# Patient Record
Sex: Female | Born: 1990 | Race: Black or African American | Hispanic: No | Marital: Single | Smoking: Former smoker
Health system: Southern US, Community
[De-identification: ages and names within clinical notes are randomized; demographics above are authoritative.]

## PROBLEM LIST (undated history)

## (undated) DIAGNOSIS — I10 Essential (primary) hypertension: Secondary | ICD-10-CM

## (undated) DIAGNOSIS — B009 Herpesviral infection, unspecified: Secondary | ICD-10-CM

## (undated) DIAGNOSIS — N289 Disorder of kidney and ureter, unspecified: Secondary | ICD-10-CM

## (undated) HISTORY — PX: COSMETIC SURGERY: SHX468

## (undated) HISTORY — DX: Essential (primary) hypertension: I10

## (undated) HISTORY — PX: DILATION AND CURETTAGE OF UTERUS: SHX78

---
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
## 2008-03-28 ENCOUNTER — Emergency Department (HOSPITAL_COMMUNITY): Admission: EM | Admit: 2008-03-28 | Discharge: 2008-03-28 | Payer: Self-pay | Admitting: Emergency Medicine

## 2008-04-05 ENCOUNTER — Emergency Department (HOSPITAL_COMMUNITY): Admission: EM | Admit: 2008-04-05 | Discharge: 2008-04-05 | Payer: Self-pay | Admitting: Emergency Medicine

## 2008-08-27 ENCOUNTER — Emergency Department (HOSPITAL_COMMUNITY): Admission: EM | Admit: 2008-08-27 | Discharge: 2008-08-27 | Payer: Self-pay | Admitting: Emergency Medicine

## 2009-08-07 ENCOUNTER — Emergency Department (HOSPITAL_COMMUNITY): Admission: EM | Admit: 2009-08-07 | Discharge: 2009-08-07 | Payer: Self-pay | Admitting: Emergency Medicine

## 2010-02-02 ENCOUNTER — Other Ambulatory Visit: Admission: RE | Admit: 2010-02-02 | Discharge: 2010-02-02 | Payer: Self-pay | Admitting: Obstetrics and Gynecology

## 2010-02-08 ENCOUNTER — Ambulatory Visit (HOSPITAL_COMMUNITY): Admission: RE | Admit: 2010-02-08 | Discharge: 2010-02-08 | Payer: Self-pay | Admitting: Obstetrics & Gynecology

## 2010-08-15 LAB — RAPID STREP SCREEN (MED CTR MEBANE ONLY): Streptococcus, Group A Screen (Direct): NEGATIVE

## 2010-08-21 ENCOUNTER — Inpatient Hospital Stay (HOSPITAL_COMMUNITY)
Admission: AD | Admit: 2010-08-21 | Discharge: 2010-08-23 | DRG: 775 | Disposition: A | Discharge status: Home / Self Care | Payer: Medicaid Other | Source: Ambulatory Visit | Attending: Family Medicine | Admitting: Family Medicine

## 2010-08-21 LAB — CBC
HCT: 35.6 % — ABNORMAL LOW (ref 36.0–46.0)
Hemoglobin: 12.1 g/dL (ref 12.0–15.0)
MCH: 30.4 pg (ref 26.0–34.0)
MCV: 89.4 fL (ref 78.0–100.0)
Platelets: 268 10*3/uL (ref 150–400)
RBC: 3.98 MIL/uL (ref 3.87–5.11)
RDW: 13.3 % (ref 11.5–15.5)
WBC: 10 10*3/uL (ref 4.0–10.5)

## 2010-08-21 LAB — RPR: RPR Ser Ql: NONREACTIVE

## 2010-09-01 LAB — PREGNANCY, URINE: Preg Test, Ur: NEGATIVE

## 2010-10-08 NOTE — Discharge Summary (Signed)
NAMEEZRIE, BUNYAN          ACCOUNT NO.:  [id number]   MEDICAL RECORD NO.:  [id number]          PATIENT TYPE:  OIB   LOCATION:  A415                          FACILITY:  [facility]   PHYSICIAN:  Tilda Burrow, M.D. DATE OF BIRTH:  02-May-1991   DATE OF ADMISSION:  01/18/2006  DATE OF DISCHARGE:  08/29/2007LH                                 DISCHARGE SUMMARY   The patient's laboratory findings and ultrasound findings are all within  normal limits.  I even spoke with Dr. Tyron Russell in radiology, and he reviewed  the films again and noted no kidney stone on review.  The patient states  that her pain is much improved now and desires discharge home, so we are  going to discharge her home to followup in the office one day next week or  p.r.n.      Zerita Boers, Lanier Clam      Tilda Burrow, M.D.  Electronically Signed    DL/MEDQ  D:  09/81/1914  T:  01/18/2006  Job:  [id number]

## 2010-10-08 NOTE — Discharge Summary (Signed)
NAMECAYLEA, Laura Andersen          ACCOUNT NO.:  [id number]   MEDICAL RECORD NO.:  [id number]          PATIENT TYPE:  INP   LOCATION:  A413                          FACILITY:  [facility]   PHYSICIAN:  Tilda Burrow, M.D. DATE OF BIRTH:  02-03-1991   DATE OF ADMISSION:  05/13/2006  DATE OF DISCHARGE:  12/25/2007LH                               DISCHARGE SUMMARY   ADMITTING DIAGNOSIS:  Pregnancy term, elective induction of labor.   DISCHARGE DIAGNOSIS:  Pregnancy term delivered.   PROCEDURE:  Foley bulb cervical ripening December 22. December 23  Pitocin induction of labor. December 23 had continuous lumbar epidural  catheter placed by Dr. Tilda Burrow. December 23 spontaneous vertex  vaginal delivery by Dr. Tilda Burrow.   HOSPITAL SUMMARY:  This 20 year old female was admitted for induction of  labor as per patient request. She is [redacted] weeks gestation. Was admitted  with cervix 2 cm, 75% effaced, -2.   HOSPITAL COURSE:  The patient was admitted. Had a Foley bulb placed  overnight with good results. She received IV analgesics and then  epidural. Pitocin augmentation with labor was initiated. The patient  went on to spontaneous vertex vaginal delivery with healthy infant,  Apgars 9, 9, female infant. There were small lacerations requiring single  suture for tissue repair. The patient had an uneventful postpartum  course. Will be followed up in our office in four weeks for  contraception counseling and future plans.      Tilda Burrow, M.D.  Electronically Signed     JVF/MEDQ  D:  06/02/2006  T:  06/02/2006  Job:  [id number]

## 2010-10-08 NOTE — Discharge Summary (Signed)
Laura Andersen, Laura Andersen          ACCOUNT NO.:  [id number]   MEDICAL RECORD NO.:  [id number]          PATIENT TYPE:  OIB   LOCATION:  LDR5                          FACILITY:  [facility]   PHYSICIAN:  Tilda Burrow, M.D. DATE OF BIRTH:  01-22-1991   DATE OF ADMISSION:  01/21/2006  DATE OF DISCHARGE:  09/02/2007LH                                 DISCHARGE SUMMARY   OBSERVATION TIME:  0818 hours.   OBSERVATION ADMIT:  4 p.m. September 1.   DISCHARGE:  10 a.m. September 2.   HOSPITAL SUMMARY:  This 20 year old female pregnant with [facility] April 23, 2006, based on menstrual history, placing her at approximately [redacted] weeks  gestation, presents with malaise, low-grade temperature, feeling ill for  approximately one day.  She presents to the hospital complaining of a mild  cough, as well as some nausea.  Exam shows bronchitic changes in the left  lung fields more than the right.  Vital signs on admission include  temperature right at 100.  Review of old records show that she had had a  renal ultrasound  early in the pregnancy which showed a complex cyst in the  right kidney, 3.4 x 3.1 x 3.9 cm, which is to be monitored and followed up  postpartum.   HOSPITAL COURSE:  The patient was felt to be a possible outpatient  candidate.  A CBC was ordered which showed white count 11,900, hemoglobin  10.6, hematocrit 30.5.  Urinalysis was obtained with specific gravity 1.020,  trace hemoglobin, 30 mg percent protein, and small leukocyte esterase with  the possibility of a urinary tract infection, as well, is considered.  The  patient spiked a fever to 103.  Did not have any CVA tenderness.  She was  treated with azithromycin for presumed bronchitis or early pneumonia.  Overnight, she felt significantly better without malaise.  The following  day, the back was nontender so she was discharged home on erythromycin and  Macrodantin for followup in two weeks.   DISCHARGE DIAGNOSES:  1. Bronchitis.  2.  Urinary tract infection.  3. Complex cyst right kidney, stable   FOLLOWUP:  Follow up in three days at [facility].      Tilda Burrow, M.D.  Electronically Signed    JVF/MEDQ  D:  01/22/2006  T:  01/23/2006  Job:  [id number]

## 2010-10-08 NOTE — H&P (Signed)
NAMEMARLET, Laura Andersen          ACCOUNT NO.:  [id number]   MEDICAL RECORD NO.:  [id number]          PATIENT TYPE:  INP   LOCATION:  A413                          FACILITY:  [facility]   PHYSICIAN:  Tilda Burrow, M.D. DATE OF BIRTH:  1990-10-01   DATE OF ADMISSION:  05/13/2006  DATE OF DISCHARGE:  LH                              HISTORY & PHYSICAL   ADMITTING DIAGNOSIS:  Pregnancy [redacted] weeks gestation, elective induction  of labor.   HISTORY OF PRESENT ILLNESS:  This 20 year old female, gravida 2, para 0,  AB 1, LMP August 04, 2005, which placed initial [facility] of May 11, 2006,  was admitted at [redacted] weeks gestation after being followed through our  office.  She has had 15 prenatal visits, has made steady progress, and  is now 2 cm, 75% effaced, -2, very posterior cervix, and is admitted as  per her request for induction of labor as we enter the holidays.   PAST MEDICAL HISTORY:  Abnormal Pap smear with HPV present, but  otherwise medically she has a benign medical history.   SURGICAL HISTORY:  D&C in 2005 for miscarriage.   ALLERGIES:  AMOXICILLIN.   SOCIAL HISTORY:  High school student dropout, allegedly seeking GED.  Baby's father is supportive.  Works at __________.  Lives with parents.   Height 5 feet 6 inches, weight 175 pounds, approximately 25-pound weight  gain.   PRENATAL LABORATORIES:  Blood type O positive.  Urine drug screen  positive for THC initially.  Rubella immunity equivocal.  Hemoglobin 12,  hematocrit 37.  Hepatitis, HIV, RPR, GC, and Chlamydia.  GC positive  initially, treated, and repeated, and is negative.  Group B  Streptococcus is negative as well.  She is admitted for labor  management.   PHYSICAL EXAMINATION:  ABDOMEN:  Shows a term-size fetus, estimated  fetal weight 6-1/2 to 7 pounds, 37 cm fundal height.  PELVIC:  Cervix 2, 75, -2, posterior, vertex presentation.   PLAN:  Foley bulb overnight.  Foley bulb was approximately at  approximately 6:30  p.m., and with the significant cervical changes  already present, it gives her a lot of pressure almost immediately.  We  will start the IV, give her some analgesics, and attempt to manage her  pain over the next hour.  Anticipate good cervical changes in response  to Foley bulb, and will proceed with Pitocin in the morning early.      Tilda Burrow, M.D.  Electronically Signed     JVF/MEDQ  D:  05/13/2006  T:  05/15/2006  Job:  [id number]   cc:   [facility]   Jeoffrey Massed, MD  Fax: [phone]

## 2010-10-08 NOTE — Op Note (Signed)
NAMEDERRISHA, FOOS          ACCOUNT NO.:  [id number]   MEDICAL RECORD NO.:  [id number]          PATIENT TYPE:  INP   LOCATION:  A413                          FACILITY:  [facility]   PHYSICIAN:  Tilda Burrow, M.D. DATE OF BIRTH:  [birth year]   DATE OF PROCEDURE:  05/14/2006  DATE OF DISCHARGE:                               OPERATIVE REPORT   Bernardette progressed nicely through labor.  After epidural was placed at  approximately 8:30 a.m. she had amniotomy and progressed over the next  three hours to completely dilated.  A brief second stage was followed by  expulsion from a direct OA position of a healthy female infant, Apgars 9  and 9.  The left arm was released before the fetal body and axillary  traction used to guide the fetus out.  There was a slight malodor.  The  cord was clamped after the infant had been on the abdomen for a minute.  Apgars 9 and 9 were assigned.  The infant was taken to the warmer for  subsequent care.  The placenta delivered intact Tomasa Blase presentation.  Cord blood gases were obtained and are pending at this time.  There was  a small right labia minora laceration that required a single suture for  tissue injury approximation.  Otherwise, delivery was uncomplicated and  EBL 450 mL.  Sponge and needle counts were correct.      Tilda Burrow, M.D.  Electronically Signed     JVF/MEDQ  D:  05/14/2006  T:  05/15/2006  Job:  [id number]   cc:   Jeoffrey Massed, MD  Fax: [phone]

## 2010-10-08 NOTE — Op Note (Signed)
[name], Laura Andersen          ACCOUNT NO.:  [id number]   MEDICAL RECORD NO.:  [id number]          PATIENT TYPE:  INP   LOCATION:  A413                          FACILITY:  [facility]   PHYSICIAN:  Tilda Burrow, M.D. DATE OF BIRTH:  [birth year]   DATE OF PROCEDURE:  05/14/2006  DATE OF DISCHARGE:  05/16/2006                               OPERATIVE REPORT   Continuous lumbar epidural catheter placed after prepping and draping  the back.  Epidural was placed at appropriately 8:30 a.m.  Loss-of-  resistance technique was used at the L2-3 interspace to identify the  epidural space after which 5 mL of 1.5% Xylocaine with epinephrine was  infused followed by placement of the epidural catheter 3 cm in the  epidural space, taping it to the back after removal of the Tuohy needle  and connection to the pulp where it was administered at 14 mL per hour  after a 7 mL fluid bolus.  The patient tolerated the procedure well with  excellent analgesic effect at T10 levels.      Tilda Burrow, M.D.  Electronically Signed     JVF/MEDQ  D:  06/02/2006  T:  06/02/2006  Job:  [id number]

## 2010-10-08 NOTE — Op Note (Signed)
NAME:  Laura Andersen, Laura Andersen                    ACCOUNT NO.:  [id number]   MEDICAL RECORD NO.:  [id number]                   PATIENT TYPE:  INP   LOCATION:  LDR3                                 FACILITY:  [facility]   PHYSICIAN:  Lazaro Arms, M.D.                DATE OF BIRTH:  [birth year]   DATE OF PROCEDURE:  05/24/2003  DATE OF DISCHARGE:                                 OPERATIVE REPORT   PREOPERATIVE DIAGNOSIS:  Incomplete AB in the first trimester.   POSTOPERATIVE DIAGNOSIS:  Incomplete AB in the first trimester.   PROCEDURE:  A cervical dilatation with uterine curettage.   SURGEON:  Lazaro Arms, M.D.   ANESTHESIA:  MAC with paracervical block.   FINDINGS:  The patient presented to the emergency room last night  complaining of vaginal bleeding.  She was found to have a 7 week 6 day  embryo with no fetal cardiac activity, a low HCG of 1500 and she was  bleeding.  The cervix was not opened.   DESCRIPTION OF OPERATION:  The patient was taken to the operating room and  placed in the supine position where she underwent MAC anesthesia.  A Grave's  speculum was placed after prep and drape.  She had a paracervical block  placed using 1% lidocaine plain.  The cervix was dilated serially to allow  passage of an 8 curved suction curet.  Three passes were made.  A gentle  sharp curettage was performed.  Another pass with the suction curet was  performed and the uterus was evacuated without difficulty.  The patient  tolerated the procedure well.  She experienced minimal blood loss, was taken  to the recovery room in good stable condition.  All counts were correct.      ___________________________________________                                            Lazaro Arms, M.D.   Loraine Maple  D:  05/24/2003  T:  05/24/2003  Job:  [id number]

## 2010-10-08 NOTE — H&P (Signed)
NAMETIMMY, CLEVERLY          ACCOUNT NO.:  [id number]   MEDICAL RECORD NO.:  [id number]          PATIENT TYPE:  OIB   LOCATION:  A415                          FACILITY:  [facility]   PHYSICIAN:  Tilda Burrow, M.D. DATE OF BIRTH:  04/06/1991   DATE OF ADMISSION:  01/18/2006  DATE OF DISCHARGE:  LH                                HISTORY & PHYSICAL   HISTORY OF PRESENT ILLNESS:  Laura Andersen is a 20 year old gravida 2, para 0 --  EDC is May 11, 2006 -- at 23 weeks and with severe left-side and flank  pain since 6:30 a.m.   MEDICAL HISTORY:  Positive for HPV.   SURGICAL HISTORY:  Positive for a D&C.   ALLERGIES:  She is allergic to AMOXICILLIN.   PRENATAL COURSE:  Uneventful up until this point.  Blood type is O positive.  UDS is positive for THC.  Rubella is equivocal.  Hepatitis B surface antigen  is negative.  HIV is negative.  Serology is nonreactive.  GC initially was  positive; the patient was treated.  There is not a repeat negative on here.  Her AFP was normal.   ASSESSMENT:  Vital signs are stable.  Fetal heart rate is stable.  There is  much fetal activity noted.  Abdomen is soft.  Tender on the left side with  exam.  Also, there is mild costovertebral angle tenderness.   PLAN:  We are going to get a CBC, a catheterized UA, an ultrasound and  manage her pain with IV pain medicine and reevaluate when her labs and  ultrasound are done.      Laura Andersen, Lanier Clam      Tilda Burrow, M.D.  Electronically Signed    DL/MEDQ  D:  16/02/9603  T:  01/18/2006  Job:  [id number]   cc:   [facility]

## 2010-11-06 ENCOUNTER — Emergency Department (HOSPITAL_COMMUNITY)
Admission: EM | Admit: 2010-11-06 | Discharge: 2010-11-06 | Disposition: A | Discharge status: Home / Self Care | Payer: Medicaid Other | Attending: Emergency Medicine | Admitting: Emergency Medicine

## 2010-11-06 ENCOUNTER — Emergency Department (HOSPITAL_COMMUNITY): Payer: Medicaid Other

## 2010-11-06 DIAGNOSIS — N201 Calculus of ureter: Secondary | ICD-10-CM | POA: Insufficient documentation

## 2010-11-06 LAB — URINALYSIS, ROUTINE W REFLEX MICROSCOPIC
Bilirubin Urine: NEGATIVE
Glucose, UA: NEGATIVE mg/dL
Ketones, ur: NEGATIVE mg/dL
Nitrite: NEGATIVE
Protein, ur: NEGATIVE mg/dL
Specific Gravity, Urine: >1.03 — ABNORMAL HIGH (ref 1.005–1.030)
Urobilinogen, UA: 0.2 mg/dL (ref 0.0–1.0)
pH: 6.5 (ref 5.0–8.0)

## 2010-11-06 LAB — URINE MICROSCOPIC-ADD ON

## 2010-11-06 LAB — DIFFERENTIAL
Basophils Absolute: 0 10*3/uL (ref 0.0–0.1)
Basophils Relative: 0 % (ref 0–1)
Eosinophils Absolute: 0.1 10*3/uL (ref 0.0–0.7)
Eosinophils Relative: 1 % (ref 0–5)
Lymphocytes Relative: 31 % (ref 12–46)
Lymphs Abs: 2.9 10*3/uL (ref 0.7–4.0)
Monocytes Absolute: 0.5 10*3/uL (ref 0.1–1.0)
Monocytes Relative: 6 % (ref 3–12)
Neutro Abs: 5.6 10*3/uL (ref 1.7–7.7)
Neutrophils Relative %: 62 % (ref 43–77)

## 2010-11-06 LAB — BASIC METABOLIC PANEL
BUN: 9 mg/dL (ref 6–23)
CO2: 24 mEq/L (ref 19–32)
Calcium: 10 mg/dL (ref 8.4–10.5)
Chloride: 105 mEq/L (ref 96–112)
Creatinine, Ser: 0.84 mg/dL (ref 0.50–1.10)
GFR calc Af Amer: >60 mL/min (ref >60)
GFR calc non Af Amer: >60 mL/min (ref >60)
Glucose, Bld: 98 mg/dL (ref 70–99)
Potassium: 3.5 mEq/L (ref 3.5–5.1)
Sodium: 139 mEq/L (ref 135–145)

## 2010-11-06 LAB — CBC
HCT: 37.6 % (ref 36.0–46.0)
Hemoglobin: 12.7 g/dL (ref 12.0–15.0)
MCH: 29.7 pg (ref 26.0–34.0)
MCHC: 33.8 g/dL (ref 30.0–36.0)
MCV: 87.9 fL (ref 78.0–100.0)
Platelets: 397 10*3/uL (ref 150–400)
RBC: 4.28 MIL/uL (ref 3.87–5.11)
RDW: 12.4 % (ref 11.5–15.5)
WBC: 9.1 10*3/uL (ref 4.0–10.5)

## 2010-11-06 LAB — POCT PREGNANCY, URINE: Preg Test, Ur: NEGATIVE

## 2011-02-15 LAB — WET PREP, GENITAL
Clue Cells Wet Prep HPF POC: NONE SEEN
Trich, Wet Prep: NONE SEEN
WBC, Wet Prep HPF POC: NONE SEEN

## 2011-02-15 LAB — URINALYSIS, ROUTINE W REFLEX MICROSCOPIC
Bilirubin Urine: NEGATIVE
Glucose, UA: NEGATIVE
Ketones, ur: NEGATIVE
Leukocytes, UA: NEGATIVE
Nitrite: NEGATIVE
Protein, ur: NEGATIVE
Specific Gravity, Urine: 1.025
Urobilinogen, UA: 0.2
pH: 5.5

## 2011-02-15 LAB — PREGNANCY, URINE: Preg Test, Ur: NEGATIVE

## 2011-02-15 LAB — URINE MICROSCOPIC-ADD ON

## 2011-02-15 LAB — GC/CHLAMYDIA PROBE AMP, GENITAL: Chlamydia, DNA Probe: NEGATIVE

## 2011-02-17 LAB — DIFFERENTIAL
Basophils Absolute: 0.1
Eosinophils Absolute: 0
Eosinophils Relative: 1
Lymphs Abs: 1.9
Neutrophils Relative %: 74

## 2011-02-17 LAB — BASIC METABOLIC PANEL
BUN: 8
Chloride: 107
Creatinine, Ser: 0.75
GFR calc non Af Amer: >60
Glucose, Bld: 99

## 2011-02-17 LAB — HCG, SERUM, QUALITATIVE: Preg, Serum: NEGATIVE

## 2011-02-17 LAB — CBC
HCT: 36.6
MCV: 92.8
Platelets: 334
RDW: 11.6
WBC: 9.2

## 2011-02-17 LAB — URINALYSIS, ROUTINE W REFLEX MICROSCOPIC
Bilirubin Urine: NEGATIVE
Ketones, ur: NEGATIVE
Nitrite: NEGATIVE
Protein, ur: NEGATIVE

## 2011-02-22 LAB — URINALYSIS, ROUTINE W REFLEX MICROSCOPIC
Bilirubin Urine: NEGATIVE
Hgb urine dipstick: NEGATIVE
Ketones, ur: NEGATIVE
Nitrite: NEGATIVE
Protein, ur: NEGATIVE
Specific Gravity, Urine: 1.02
Urobilinogen, UA: 0.2

## 2011-02-22 LAB — PREGNANCY, URINE: Preg Test, Ur: NEGATIVE

## 2011-02-22 LAB — GC/CHLAMYDIA PROBE AMP, GENITAL
Chlamydia, DNA Probe: NEGATIVE
GC Probe Amp, Genital: NEGATIVE

## 2011-03-03 LAB — URINE MICROSCOPIC-ADD ON

## 2011-03-03 LAB — I-STAT 8, (EC8 V) (CONVERTED LAB)
Acid-base deficit: 2
Chloride: 105
Glucose, Bld: 83
Hemoglobin: 13.3
Potassium: 3.8
Sodium: 139
TCO2: 26

## 2011-03-03 LAB — URINALYSIS, ROUTINE W REFLEX MICROSCOPIC
Glucose, UA: NEGATIVE
Ketones, ur: NEGATIVE
Nitrite: NEGATIVE
Protein, ur: 30 — AB
Urobilinogen, UA: 1

## 2011-03-03 LAB — POCT PREGNANCY, URINE
Operator id: 234501
Preg Test, Ur: NEGATIVE

## 2011-06-18 ENCOUNTER — Emergency Department (HOSPITAL_COMMUNITY): Payer: Medicaid Other

## 2011-06-18 ENCOUNTER — Encounter (HOSPITAL_COMMUNITY): Payer: Self-pay | Admitting: *Deleted

## 2011-06-18 ENCOUNTER — Emergency Department (HOSPITAL_COMMUNITY)
Admission: EM | Admit: 2011-06-18 | Discharge: 2011-06-18 | Disposition: A | Discharge status: Home / Self Care | Payer: Medicaid Other | Attending: Emergency Medicine | Admitting: Emergency Medicine

## 2011-06-18 DIAGNOSIS — F17200 Nicotine dependence, unspecified, uncomplicated: Secondary | ICD-10-CM | POA: Insufficient documentation

## 2011-06-18 DIAGNOSIS — N200 Calculus of kidney: Secondary | ICD-10-CM

## 2011-06-18 DIAGNOSIS — R319 Hematuria, unspecified: Secondary | ICD-10-CM | POA: Insufficient documentation

## 2011-06-18 DIAGNOSIS — N390 Urinary tract infection, site not specified: Secondary | ICD-10-CM

## 2011-06-18 DIAGNOSIS — R109 Unspecified abdominal pain: Secondary | ICD-10-CM | POA: Insufficient documentation

## 2011-06-18 DIAGNOSIS — Z79899 Other long term (current) drug therapy: Secondary | ICD-10-CM | POA: Insufficient documentation

## 2011-06-18 DIAGNOSIS — R112 Nausea with vomiting, unspecified: Secondary | ICD-10-CM | POA: Insufficient documentation

## 2011-06-18 HISTORY — DX: Herpesviral infection, unspecified: B00.9

## 2011-06-18 LAB — URINALYSIS, ROUTINE W REFLEX MICROSCOPIC
Bilirubin Urine: NEGATIVE
Ketones, ur: NEGATIVE mg/dL
Nitrite: POSITIVE — AB
Specific Gravity, Urine: 1.025 (ref 1.005–1.030)
Urobilinogen, UA: 0.2 mg/dL (ref 0.0–1.0)

## 2011-06-18 MED ORDER — CIPROFLOXACIN HCL 250 MG PO TABS
500.0000 mg | ORAL_TABLET | Freq: Once | ORAL | Status: AC
  Administered 2011-06-18: 500 mg via ORAL
  Filled 2011-06-18: qty 2

## 2011-06-18 MED ORDER — CIPROFLOXACIN HCL 250 MG PO TABS: 250.0000 mg | ORAL_TABLET | Freq: Two times a day (BID) | ORAL | Status: AC

## 2011-06-18 MED ORDER — HYDROCODONE-ACETAMINOPHEN 5-325 MG PO TABS: 1.0000 | ORAL_TABLET | ORAL | Status: AC | PRN

## 2011-06-18 MED ORDER — ONDANSETRON HCL 4 MG/2ML IJ SOLN
4.0000 mg | Freq: Once | INTRAMUSCULAR | Status: AC
  Administered 2011-06-18: 4 mg via INTRAVENOUS
  Filled 2011-06-18: qty 2

## 2011-06-18 MED ORDER — PROMETHAZINE HCL 25 MG PO TABS: 12.5000 mg | ORAL_TABLET | Freq: Four times a day (QID) | ORAL | Status: DC | PRN

## 2011-06-18 MED ORDER — HYDROMORPHONE HCL PF 1 MG/ML IJ SOLN
1.0000 mg | Freq: Once | INTRAMUSCULAR | Status: AC
  Administered 2011-06-18: 1 mg via INTRAVENOUS
  Filled 2011-06-18: qty 1

## 2011-06-18 NOTE — ED Notes (Signed)
Dr. Strand in to see patient at this time.  

## 2011-06-18 NOTE — ED Provider Notes (Addendum)
Urine with 21History     CSN: [id number]  Arrival date & time 06/18/11  4098   First MD Initiated Contact with Patient 06/18/11 [phone]      Chief Complaint  Patient presents with  . Flank Pain    (Consider location/radiation/quality/duration/timing/severity/associated sxs/prior treatment) HPI Rynlee A Rynders is a 21 y.o. female with a h/o kidney stones  who presents to the Emergency Department complaining of right flank pain, nausea, vomiting, and hematuria. Flank pain began at 5:30 AM this morning. Patient has been treating what she thought was a urinary tract infection associated with hematuria usingover-the-counter AZO for the last several days without relief.she denies fever, chills, chest pain, shortness of breath. Past Medical History  Diagnosis Date  . Herpes     History reviewed. No pertinent past surgical history.  History reviewed. No pertinent family history.  History  Substance Use Topics  . Smoking status: Current Everyday Smoker -- 1.0 packs/day  . Smokeless tobacco: Not on file  . Alcohol Use: No    OB History    Grav Para Term Preterm Abortions TAB SAB Ect Mult Living                  Review of Systems 10 Systems reviewed and are negative for acute change except as noted in the HPI. Allergies  Amoxicillin  Home Medications   Current Outpatient Rx  Name Route Sig Dispense Refill  . UNKNOWN TO PATIENT  Reports medication for herpes virus, but doesn't remember name of medication.    Marland Kitchen HYDROCODONE-ACETAMINOPHEN 5-325 MG PO TABS Oral Take 1 tablet by mouth every 4 (four) hours as needed for pain. 15 tablet 0  . PROMETHAZINE HCL 25 MG PO TABS Oral Take 0.5 tablets (12.5 mg total) by mouth every 6 (six) hours as needed for nausea. 10 tablet 0    BP 152/87  Pulse 110  Temp 98.2 F (36.8 C)  Resp 22  Ht 5\' 6"  (1.676 m)  Wt 162 lb (73.483 kg)  BMI 26.15 kg/m2  SpO2 100%  Physical Exam  Nursing note and vitals reviewed. Constitutional: She is  oriented to person, place, and time. She appears well-developed and well-nourished. She appears distressed.  HENT:  Head: Normocephalic.  Right Ear: External ear normal.  Left Ear: External ear normal.  Mouth/Throat: Oropharynx is clear and moist.  Eyes: Conjunctivae and EOM are normal. Pupils are equal, round, and reactive to light.  Neck: Normal range of motion. Neck supple.  Cardiovascular: Normal rate, normal heart sounds and intact distal pulses.   Pulmonary/Chest: Effort normal and breath sounds normal.  Abdominal: Soft. Bowel sounds are normal. She exhibits no mass. There is tenderness. There is no rebound and no guarding.       Right sided upper and mid right abdomen tenderness  Genitourinary:       Mild right cva tenderness to percussion  Musculoskeletal: Normal range of motion.  Neurological: She is alert and oriented to person, place, and time. She has normal reflexes.  Skin: Skin is warm and dry.    ED Course  Procedures (including critical care time) Results for orders placed during the hospital encounter of 06/18/11  URINALYSIS, ROUTINE W REFLEX MICROSCOPIC      Component Value Range   Color, Urine YELLOW  YELLOW    APPearance CLOUDY (*) CLEAR    Specific Gravity, Urine 1.025  1.005 - 1.030    pH 6.5  5.0 - 8.0    Glucose, UA NEGATIVE  NEGATIVE (  mg/dL)   Hgb urine dipstick LARGE (*) NEGATIVE    Bilirubin Urine NEGATIVE  NEGATIVE    Ketones, ur NEGATIVE  NEGATIVE (mg/dL)   Protein, ur 161 (*) NEGATIVE (mg/dL)   Urobilinogen, UA 0.2  0.0 - 1.0 (mg/dL)   Nitrite POSITIVE (*) NEGATIVE    Leukocytes, UA MODERATE (*) NEGATIVE   POCT PREGNANCY, URINE      Component Value Range   Preg Test, Ur NEGATIVE  NEGATIVE   URINE MICROSCOPIC-ADD ON      Component Value Range   WBC, UA 21-50  <3 (WBC/hpf)   RBC / HPF 21-50  <3 (RBC/hpf)   Bacteria, UA MANY (*) RARE    Ct Abdomen Pelvis Wo Contrast  06/18/2011  *RADIOLOGY REPORT*  Clinical Data: Right-sided flank pain, history  of kidney stones, nausea, vomiting  CT ABDOMEN AND PELVIS WITHOUT CONTRAST  Technique:  Multidetector CT imaging of the abdomen and pelvis was performed following the standard protocol without intravenous contrast.  Comparison: Abdominal CT - 10/27/2010; abdominal MRI - 05/11/2007  Findings:  The lack of intravenous contrast limits the newly to evaluate solid abdominal organs.  Several tiny radiopaque gallstones are seen with an otherwise normal-appearing gallbladder, unchanged compared to the prior examination.  Normal hepatic contour.  No ascites.  There is an approximately 3.8 x 2.3 cm partially exophytic mass arising from the mid aspect of the right kidney (image 56, series 4) which appears grossly unchanged compared to abdominal MRI performed 04/2007, there is an incompletely evaluated on the present examination due to lack of intravenous contrast.  Interval passage of previously noted distal left ureteral stone.  Unchanged positioning of left sided pelvic phlebolith (image 83, series 2). No right-sided renal stones.  No hydronephrosis.  No perinephric stranding.  The noncontrast appearance of the bilateral adrenal glands, pancreas and spleen is normal.  The bowel is normal in course and caliber without wall thickening or evidence of obstruction.  Normal noncontrast appearance of the appendix.  No pneumoperitoneum, pneumatosis or portal venous gas. Normal caliber of the abdominal aorta.  No retroperitoneal, mesenteric, pelvic or inguinal lymphadenopathy.  Normal noncontrast appearance of the pelvic organs.  No free fluid in the pelvis.  Limited visualization of the lower thorax is negative for focal airspace opacity or pleural effusion.  No acute or aggressive osseous abnormalities.  IMPRESSION: 1.  No explanation for patient's right-sided flank pain. Specifically, no evidence of nephrolithiasis or urinary obstruction. Normal noncontrast appearance of the appendix.  2.  Interval passage of previously noted  left-sided distal ureteral stone. 3.  Grossly unchanged noncontrast appearance of partially exophytic right-sided renal lesion, grossly stable since the 04/2007 examination, though incompletely evaluated on this noncontrast examination.  Consider repeat enhanced abdominal MRI for further characterization as clinically indicated.  4.  Cholelithiasis without evidence of cholecystitis.  Original Report Authenticated By: Waynard Reeds, M.D.      MDM  Patient with a history of kidney stones who presents with right flank pain, nausea, vomiting x1, and no fever. CT with no evidence of nephrolithiasis or urinary obstruction on the right side.urine with evidence of infection. Initiated antibiotic therapy. Patient was given IV fluids, analgesics and antiemetics with improvement.Pt stable in ED with no significant deterioration in condition.The patient appears reasonably screened and/or stabilized for discharge and I doubt any other medical condition or other [facility] requiring further screening, evaluation, or treatment in the ED at this time prior to discharge.  MDM Reviewed: nursing note and vitals Interpretation: labs  and CT scan         Nicoletta Dress. Colon Branch, MD 06/18/11 0865  Nicoletta Dress. Colon Branch, MD 07/12/11 [phone]

## 2011-06-18 NOTE — ED Notes (Signed)
Pt reporting pain in right side.  States pain began about 0530 this morning.  Reports some nausea, vomiting x1. Repots UTI symptoms, has been treating with Azo.

## 2011-06-18 NOTE — ED Notes (Signed)
Patient reported that she started having right flank pain this morning. States she has had symptoms of a uti recently including blood in urine and pain on urination. States that she has been taking AZO for her symptoms, and now she isn't having any pain on urination but still some blood. Also states that she has a history of kidney stones. Vomited once today, also complaining of nausea.

## 2011-06-18 NOTE — ED Notes (Signed)
poct pregnancy test negative.  

## 2011-08-10 ENCOUNTER — Emergency Department (HOSPITAL_COMMUNITY)
Admission: EM | Admit: 2011-08-10 | Discharge: 2011-08-10 | Disposition: A | Discharge status: Home / Self Care | Payer: BC Managed Care – PPO | Attending: Emergency Medicine | Admitting: Emergency Medicine

## 2011-08-10 ENCOUNTER — Emergency Department (HOSPITAL_COMMUNITY): Payer: BC Managed Care – PPO

## 2011-08-10 ENCOUNTER — Encounter (HOSPITAL_COMMUNITY): Payer: Self-pay

## 2011-08-10 DIAGNOSIS — R110 Nausea: Secondary | ICD-10-CM | POA: Insufficient documentation

## 2011-08-10 DIAGNOSIS — R109 Unspecified abdominal pain: Secondary | ICD-10-CM | POA: Insufficient documentation

## 2011-08-10 LAB — URINALYSIS, ROUTINE W REFLEX MICROSCOPIC
Glucose, UA: NEGATIVE mg/dL
Hgb urine dipstick: NEGATIVE
Specific Gravity, Urine: 1.03 (ref 1.005–1.030)

## 2011-08-10 MED ORDER — KETOROLAC TROMETHAMINE 30 MG/ML IJ SOLN
30.0000 mg | Freq: Once | INTRAMUSCULAR | Status: AC
  Administered 2011-08-10: 30 mg via INTRAVENOUS
  Filled 2011-08-10: qty 1

## 2011-08-10 MED ORDER — ONDANSETRON HCL 4 MG/2ML IJ SOLN
4.0000 mg | Freq: Once | INTRAMUSCULAR | Status: AC
  Administered 2011-08-10: 4 mg via INTRAVENOUS
  Filled 2011-08-10: qty 2

## 2011-08-10 MED ORDER — SODIUM CHLORIDE 0.9 % IV SOLN
Freq: Once | INTRAVENOUS | Status: AC
  Administered 2011-08-10: 10:00:00 via INTRAVENOUS

## 2011-08-10 MED ORDER — PROMETHAZINE HCL 25 MG PO TABS: 12.5000 mg | ORAL_TABLET | Freq: Four times a day (QID) | ORAL | Status: DC | PRN

## 2011-08-10 MED ORDER — HYDROCODONE-ACETAMINOPHEN 5-325 MG PO TABS: 1.0000 | ORAL_TABLET | ORAL | Status: AC | PRN

## 2011-08-10 MED ORDER — SODIUM CHLORIDE 0.9 % IV BOLUS (SEPSIS)
1000.0000 mL | Freq: Once | INTRAVENOUS | Status: AC
  Administered 2011-08-10: 1000 mL via INTRAVENOUS

## 2011-08-10 MED ORDER — HYDROMORPHONE HCL PF 1 MG/ML IJ SOLN
1.0000 mg | Freq: Once | INTRAMUSCULAR | Status: AC
  Administered 2011-08-10: 1 mg via INTRAVENOUS
  Filled 2011-08-10: qty 1

## 2011-08-10 NOTE — ED Notes (Signed)
Pt reports history of kidney stones and c/o r side pain starting this am around 0700.  C/O nausea, no vomiting.

## 2011-08-10 NOTE — ED Provider Notes (Signed)
History   This chart was scribed for [employer]. Colon Branch, MD by Brooks Sailors. The patient was seen in room APA14/APA14. Patient's care was started at 0840.   CSN: [id number]  Arrival date & time 08/10/11  0840   First MD Initiated Contact with Patient 08/10/11 [phone]      Chief Complaint  Patient presents with  . Abdominal Pain    (Consider location/radiation/quality/duration/timing/severity/associated sxs/prior treatment) HPI Laura Andersen is a 21 y.o. female who presents to the Emergency Department complaining of constant severe right abdominal pain onset three hours ago with associated nausea. Patient has h/o of kidney stones, most recent a few months ago where pain went away on own. Denies vomiting, dysuria and hematuria.     Past Medical History  Diagnosis Date  . Herpes     Past Surgical History  Procedure Date  . Dilation and curettage of uterus     No family history on file.  History  Substance Use Topics  . Smoking status: Current Everyday Smoker -- 1.0 packs/day  . Smokeless tobacco: Not on file  . Alcohol Use: No    OB History    Grav Para Term Preterm Abortions TAB SAB Ect Mult Living                  Review of Systems 10 Systems reviewed and are negative for acute change except as noted in the HPI.  Allergies  Amoxicillin  Home Medications   Current Outpatient Rx  Name Route Sig Dispense Refill  . VALACYCLOVIR HCL 1 G PO TABS Oral Take 1,000 mg by mouth daily.      BP 133/77  Pulse 95  Temp(Src) 97.9 F (36.6 C) (Oral)  Resp 24  Ht 5\' 6"  (1.676 m)  Wt 153 lb (69.4 kg)  BMI 24.69 kg/m2  SpO2 100%  Physical Exam  Nursing note and vitals reviewed. Constitutional: She is oriented to person, place, and time. She appears well-developed and well-nourished. No distress.       uncomfortable  HENT:  Head: Normocephalic and atraumatic.  Eyes: EOM are normal. Pupils are equal, round, and reactive to light.  Neck: Neck supple. No tracheal  deviation present.  Cardiovascular: Normal rate, regular rhythm and normal heart sounds.  Exam reveals no gallop and no friction rub.   No murmur heard. Pulmonary/Chest: Effort normal. No respiratory distress. She has no wheezes.  Abdominal: Soft. She exhibits no distension. There is tenderness (right flank and right side). There is CVA tenderness.  Musculoskeletal: Normal range of motion. She exhibits no edema.  Neurological: She is alert and oriented to person, place, and time. No sensory deficit.  Skin: Skin is warm and dry.  Psychiatric: She has a normal mood and affect. Her behavior is normal.    ED Course  Procedures (including critical care time) DIAGNOSTIC STUDIES: Oxygen Saturation is 100% on room air, normal by my interpretation.    COORDINATION OF CARE: 9:30AM-Patient informed of current plan for treatment and evaluation and agrees with plan at this time.  Results for orders placed during the hospital encounter of 08/10/11  URINALYSIS, ROUTINE W REFLEX MICROSCOPIC      Component Value Range   Color, Urine YELLOW  YELLOW    APPearance CLEAR  CLEAR    Specific Gravity, Urine 1.030  1.005 - 1.030    pH 6.0  5.0 - 8.0    Glucose, UA NEGATIVE  NEGATIVE (mg/dL)   Hgb urine dipstick NEGATIVE  NEGATIVE  Bilirubin Urine NEGATIVE  NEGATIVE    Ketones, ur NEGATIVE  NEGATIVE (mg/dL)   Protein, ur NEGATIVE  NEGATIVE (mg/dL)   Urobilinogen, UA 0.2  0.0 - 1.0 (mg/dL)   Nitrite NEGATIVE  NEGATIVE    Leukocytes, UA NEGATIVE  NEGATIVE   POCT PREGNANCY, URINE      Component Value Range   Preg Test, Ur NEGATIVE  NEGATIVE    Ct Abdomen Pelvis Wo Contrast  08/10/2011  *RADIOLOGY REPORT*  Clinical Data: Right flank pain.  History of stones.  CT ABDOMEN AND PELVIS WITHOUT CONTRAST  Technique:  Multidetector CT imaging of the abdomen and pelvis was performed following the standard protocol without intravenous contrast.  Comparison: 06/18/2011 and 11/06/2010.  Renal ultrasound 02/08/2010. MR  abdomen 05/11/2007.  Findings: Lung bases show mild dependent atelectasis bilaterally. Heart size normal.  No pericardial or pleural effusion.  Liver is unremarkable.  Tiny stones layer in the gallbladder. Adrenal glands are unremarkable.  Mixed attenuation lesion is seen in the interpolar right kidney, measuring 3 cm, stable from 06/18/2011, but possibly enlarged from 11/06/2010.  No urinary stones or obstruction. Spleen, pancreas, stomach and bowel are unremarkable.  Uterus and ovaries are visualized.  No free fluid.  No pathologically enlarged lymph nodes.  No worrisome lytic or sclerotic lesions.  IMPRESSION:  1.  No findings to explain the patient's given symptoms.  No urinary stones or obstruction. 2.  Complex right renal lesion may have increased in size from exams predating 2013.  Repeat non emergent MR abdomen with and without contrast may be helpful in further evaluation, as clinically warranted. 3.  Cholelithiasis.  Original Report Authenticated By: Reyes Ivan, M.D.    MDM  Patient with right flank and side pain since this morning at 7 AM. CT without evidence of stones. Does show gall stones which may be source of pain. Given IVF, analgesic, antiemetic, with relief. Will be given referral to surgeon. Pt feels improved after observation and/or treatment in ED.Pt stable in ED with no significant deterioration in condition.The patient appears reasonably screened and/or stabilized for discharge and I doubt any other medical condition or other [facility] requiring further screening, evaluation, or treatment in the ED at this time prior to discharge.   I personally performed the services described in this documentation, which was scribed in my presence. The recorded information has been reviewed and considered.   MDM Reviewed: nursing note and vitals Interpretation: labs and CT scan           Nicoletta Dress. Colon Branch, MD 08/10/11 [phone]

## 2011-08-10 NOTE — Discharge Instructions (Signed)
Your CT scan did not show kidney stones. It did show that you have gallstones. That may be the source of your pain. I have given new prescriptions for nausea medicine and pain medicine to be used as needed. If you continue to have episodes of pain, followup with the surgeon on yourr paperwork.You  may always  to return to the emergency room for evaluation.

## 2011-11-16 ENCOUNTER — Emergency Department (HOSPITAL_COMMUNITY)
Admission: EM | Admit: 2011-11-16 | Discharge: 2011-11-16 | Discharge status: Against Medical Advice | Payer: Self-pay | Attending: Emergency Medicine | Admitting: Emergency Medicine

## 2011-11-16 ENCOUNTER — Encounter (HOSPITAL_COMMUNITY): Payer: Self-pay | Admitting: *Deleted

## 2011-11-16 DIAGNOSIS — Z79899 Other long term (current) drug therapy: Secondary | ICD-10-CM | POA: Insufficient documentation

## 2011-11-16 DIAGNOSIS — Z880 Allergy status to penicillin: Secondary | ICD-10-CM | POA: Insufficient documentation

## 2011-11-16 DIAGNOSIS — F17200 Nicotine dependence, unspecified, uncomplicated: Secondary | ICD-10-CM | POA: Insufficient documentation

## 2011-11-16 DIAGNOSIS — N898 Other specified noninflammatory disorders of vagina: Secondary | ICD-10-CM | POA: Insufficient documentation

## 2011-11-16 DIAGNOSIS — B009 Herpesviral infection, unspecified: Secondary | ICD-10-CM | POA: Insufficient documentation

## 2011-11-16 NOTE — ED Notes (Signed)
Went to assess pt. Pt not in room. Gown on bed.

## 2011-11-16 NOTE — ED Provider Notes (Signed)
History   This chart was scribed for Laura Jakes, MD by Clarita Crane. The patient was seen in room APA08/APA08. Patient's care was started at 1408.    CSN: [id number]  Arrival date & time 11/16/11  1408   First MD Initiated Contact with Patient 11/16/11 1426      Chief Complaint  Patient presents with  . Vaginal Discharge    (Consider location/radiation/quality/duration/timing/severity/associated sxs/prior treatment) HPI Laura Andersen is a 21 y.o. female who presents to the Emergency Department complaining of mild to moderate white vaginal d/c onset 2 days ago and persistent since. Denies pruritus, dysuria, abdominal pain, back pain, chest pain, SOB, fever, chills, sore throat, congestion, HA, neck pain, cough, nausea, vomiting, diarrhea, visual changes. Denies previous history of similar. Patient with h/o D and C. LMP- Depo Provera Shot.   PCP- None  Past Medical History  Diagnosis Date  . Herpes     Past Surgical History  Procedure Date  . Dilation and curettage of uterus     History reviewed. No pertinent family history.  History  Substance Use Topics  . Smoking status: Current Everyday Smoker -- 1.0 packs/day  . Smokeless tobacco: Not on file  . Alcohol Use: Yes    OB History    Grav Para Term Preterm Abortions TAB SAB Ect Mult Living                  Review of Systems  Constitutional: Negative for fever.  HENT: Negative for rhinorrhea.   Eyes: Negative for pain.  Respiratory: Negative for cough and shortness of breath.   Cardiovascular: Negative for chest pain.  Gastrointestinal: Negative for nausea, vomiting, abdominal pain and diarrhea.  Genitourinary: Positive for vaginal discharge. Negative for dysuria.  Musculoskeletal: Negative for back pain.  Skin: Negative for rash.  Neurological: Negative for weakness and headaches.    Allergies  Amoxicillin  Home Medications   Current Outpatient Rx  Name Route Sig Dispense Refill  .  DIPHENHYDRAMINE HCL 25 MG PO CAPS Oral Take 25 mg by mouth at bedtime as needed. For allergies    . VALACYCLOVIR HCL 1 G PO TABS Oral Take 1,000 mg by mouth daily.      BP 112/75  Pulse 100  Temp 98.2 F (36.8 C) (Oral)  Resp 20  Ht 5\' 6"  (1.676 m)  Wt 172 lb (78.019 kg)  BMI 27.76 kg/m2  SpO2 99%  Physical Exam  Nursing note and vitals reviewed. Constitutional: She is oriented to person, place, and time. She appears well-developed and well-nourished. No distress.  HENT:  Head: Normocephalic and atraumatic.  Mouth/Throat: Oropharynx is clear and moist.  Eyes: EOM are normal. Pupils are equal, round, and reactive to light.  Neck: Neck supple. No tracheal deviation present.  Cardiovascular: Normal rate, regular rhythm and normal heart sounds.  Exam reveals no gallop and no friction rub.   No murmur heard. Pulmonary/Chest: Effort normal. No respiratory distress. She has no wheezes. She has no rales.  Abdominal: Soft. Bowel sounds are normal. She exhibits no distension. There is no tenderness.  Musculoskeletal: Normal range of motion. She exhibits no edema.  Neurological: She is alert and oriented to person, place, and time. No cranial nerve deficit or sensory deficit.  Skin: Skin is warm and dry.  Psychiatric: She has a normal mood and affect. Her behavior is normal.    ED Course  Procedures (including critical care time)  DIAGNOSTIC STUDIES: Oxygen Saturation is 99% on room air, normal by  my interpretation.    COORDINATION OF CARE: 2:52PM-Patient informed of current plan for treatment and evaluation and agrees with plan at this time.      Labs Reviewed  URINALYSIS, ROUTINE W REFLEX MICROSCOPIC  PREGNANCY, URINE  WET PREP, GENITAL  GC/CHLAMYDIA PROBE AMP, GENITAL   No results found. Results for orders placed during the hospital encounter of 08/10/11  URINALYSIS, ROUTINE W REFLEX MICROSCOPIC      Component Value Range   Color, Urine YELLOW  YELLOW   APPearance CLEAR   CLEAR   Specific Gravity, Urine 1.030  1.005 - 1.030   pH 6.0  5.0 - 8.0   Glucose, UA NEGATIVE  NEGATIVE mg/dL   Hgb urine dipstick NEGATIVE  NEGATIVE   Bilirubin Urine NEGATIVE  NEGATIVE   Ketones, ur NEGATIVE  NEGATIVE mg/dL   Protein, ur NEGATIVE  NEGATIVE mg/dL   Urobilinogen, UA 0.2  0.0 - 1.0 mg/dL   Nitrite NEGATIVE  NEGATIVE   Leukocytes, UA NEGATIVE  NEGATIVE  POCT PREGNANCY, URINE      Component Value Range   Preg Test, Ur NEGATIVE  NEGATIVE     1. Vaginal Discharge       MDM  Patient left AMA without informing anybody prior to pelvic exam being done but after being evaluated otherwise. Labs are now back she is not pregnant urinalysis not consistent with urinary tract infection with plan to pelvic exam to rule out the yeast infection or other serious infection related to the discharge.     I personally performed the services described in this documentation, which was scribed in my presence. The recorded information has been reviewed and considered.    Laura Jakes, MD 11/16/11 [phone]

## 2011-11-16 NOTE — ED Notes (Signed)
Vag d/c for 3 days, white, no pain or itching

## 2011-11-16 NOTE — ED Notes (Signed)
Dr. Deretha Emory made aware of pt left AMA

## 2012-03-12 ENCOUNTER — Other Ambulatory Visit (HOSPITAL_COMMUNITY)
Admission: RE | Admit: 2012-03-12 | Discharge: 2012-03-12 | Disposition: A | Discharge status: Home / Self Care | Payer: Medicaid Other | Source: Ambulatory Visit | Attending: Obstetrics & Gynecology | Admitting: Obstetrics & Gynecology

## 2012-03-12 ENCOUNTER — Other Ambulatory Visit: Payer: Self-pay | Admitting: Obstetrics & Gynecology

## 2012-03-12 DIAGNOSIS — Z01419 Encounter for gynecological examination (general) (routine) without abnormal findings: Secondary | ICD-10-CM | POA: Insufficient documentation

## 2013-01-03 ENCOUNTER — Ambulatory Visit: Payer: Self-pay | Admitting: Adult Health

## 2013-03-27 ENCOUNTER — Ambulatory Visit: Payer: Self-pay | Admitting: Advanced Practice Midwife

## 2013-03-27 ENCOUNTER — Encounter (INDEPENDENT_AMBULATORY_CARE_PROVIDER_SITE_OTHER): Payer: Self-pay

## 2013-03-27 ENCOUNTER — Ambulatory Visit: Payer: Self-pay | Admitting: Adult Health

## 2013-05-22 ENCOUNTER — Emergency Department (HOSPITAL_COMMUNITY)
Admission: EM | Admit: 2013-05-22 | Discharge: 2013-05-22 | Disposition: A | Discharge status: Home / Self Care | Payer: Medicaid Other | Attending: Emergency Medicine | Admitting: Emergency Medicine

## 2013-05-22 ENCOUNTER — Encounter (HOSPITAL_COMMUNITY): Payer: Self-pay | Admitting: Emergency Medicine

## 2013-05-22 DIAGNOSIS — Z8619 Personal history of other infectious and parasitic diseases: Secondary | ICD-10-CM | POA: Insufficient documentation

## 2013-05-22 DIAGNOSIS — R109 Unspecified abdominal pain: Secondary | ICD-10-CM

## 2013-05-22 DIAGNOSIS — R112 Nausea with vomiting, unspecified: Secondary | ICD-10-CM | POA: Insufficient documentation

## 2013-05-22 DIAGNOSIS — Z79899 Other long term (current) drug therapy: Secondary | ICD-10-CM | POA: Insufficient documentation

## 2013-05-22 DIAGNOSIS — Z880 Allergy status to penicillin: Secondary | ICD-10-CM | POA: Insufficient documentation

## 2013-05-22 DIAGNOSIS — F17200 Nicotine dependence, unspecified, uncomplicated: Secondary | ICD-10-CM | POA: Insufficient documentation

## 2013-05-22 DIAGNOSIS — Z87442 Personal history of urinary calculi: Secondary | ICD-10-CM | POA: Insufficient documentation

## 2013-05-22 LAB — URINE MICROSCOPIC-ADD ON

## 2013-05-22 LAB — URINALYSIS, ROUTINE W REFLEX MICROSCOPIC
Bilirubin Urine: NEGATIVE
Glucose, UA: NEGATIVE mg/dL
Protein, ur: NEGATIVE mg/dL

## 2013-05-22 MED ORDER — ONDANSETRON 4 MG PO TBDP
4.0000 mg | ORAL_TABLET | Freq: Once | ORAL | Status: AC
  Administered 2013-05-22: 4 mg via ORAL
  Filled 2013-05-22: qty 1

## 2013-05-22 MED ORDER — TAMSULOSIN HCL 0.4 MG PO CAPS: 0.4000 mg | ORAL_CAPSULE | Freq: Two times a day (BID) | ORAL | Status: DC

## 2013-05-22 MED ORDER — PROMETHAZINE HCL 25 MG PO TABS: 25.0000 mg | ORAL_TABLET | Freq: Four times a day (QID) | ORAL | Status: DC | PRN

## 2013-05-22 MED ORDER — KETOROLAC TROMETHAMINE 60 MG/2ML IM SOLN
60.0000 mg | Freq: Once | INTRAMUSCULAR | Status: AC
  Administered 2013-05-22: 60 mg via INTRAMUSCULAR
  Filled 2013-05-22: qty 2

## 2013-05-22 MED ORDER — NAPROXEN 500 MG PO TABS: 500.0000 mg | ORAL_TABLET | Freq: Two times a day (BID) | ORAL | Status: DC

## 2013-05-22 NOTE — ED Notes (Signed)
Pt ambulating independently w/ steady gait on d/c in no acute distress, A&Ox4. D/c instructions reviewed w/ pt and family - pt and family deny any further questions or concerns at present. Rx given x3  

## 2013-05-22 NOTE — ED Notes (Signed)
Pt reports acute onset of left flank pain - pt admits to vomiting today, no nausea at present - as well as watery diarrhea as well.

## 2013-05-22 NOTE — ED Provider Notes (Signed)
CSN: [id number]     Arrival date & time 05/22/13  0032 History   First MD Initiated Contact with Patient 05/22/13 0059     Chief Complaint  Patient presents with  . Flank Pain   (Consider location/radiation/quality/duration/timing/severity/associated sxs/prior Treatment) HPI Comments: 22 year old female, history of a kidney stone a couple of years ago, presents with a complaint of left-sided flank pain which was acute in onset one hour prior to arrival, associated with one episode of nausea and vomiting, persistent, nothing seems to make it better or worse. She has no associated hematuria that she has noticed, no fevers or chills, no abdominal complaints. She denies being pregnant, vaginal discharge or vaginal bleeding.  Patient is a 22 y.o. female presenting with flank pain. The history is provided by the patient.  Flank Pain    Past Medical History  Diagnosis Date  . Herpes    Past Surgical History  Procedure Laterality Date  . Dilation and curettage of uterus     History reviewed. No pertinent family history. History  Substance Use Topics  . Smoking status: Current Every Day Smoker -- 1.00 packs/day    Types: Cigarettes  . Smokeless tobacco: Not on file  . Alcohol Use: Yes   OB History   Grav Para Term Preterm Abortions TAB SAB Ect Mult Living                 Review of Systems  Genitourinary: Positive for flank pain.  All other systems reviewed and are negative.    Allergies  Amoxicillin  Home Medications   Current Outpatient Rx  Name  Route  Sig  Dispense  Refill  . diphenhydrAMINE (BENADRYL) 25 mg capsule   Oral   Take 25 mg by mouth at bedtime as needed. For allergies         . naproxen (NAPROSYN) 500 MG tablet   Oral   Take 1 tablet (500 mg total) by mouth 2 (two) times daily with a meal.   30 tablet   0   . EXPIRED: promethazine (PHENERGAN) 25 MG tablet   Oral   Take 0.5 tablets (12.5 mg total) by mouth every 6 (six) hours as needed for  nausea.   10 tablet   0   . EXPIRED: promethazine (PHENERGAN) 25 MG tablet   Oral   Take 0.5 tablets (12.5 mg total) by mouth every 6 (six) hours as needed for nausea.   10 tablet   0   . promethazine (PHENERGAN) 25 MG tablet   Oral   Take 1 tablet (25 mg total) by mouth every 6 (six) hours as needed for nausea or vomiting.   12 tablet   0   . tamsulosin (FLOMAX) 0.4 MG CAPS capsule   Oral   Take 1 capsule (0.4 mg total) by mouth 2 (two) times daily.   10 capsule   0   . valACYclovir (VALTREX) 1000 MG tablet   Oral   Take 1,000 mg by mouth daily.          BP 118/85  Temp(Src) 98.2 F (36.8 C) (Oral)  Resp 20  Ht 5\' 6"  (1.676 m)  Wt 178 lb (80.74 kg)  BMI 28.74 kg/m2  SpO2 98%  LMP 04/30/2013 Physical Exam  Nursing note and vitals reviewed. Constitutional: She appears well-developed and well-nourished. No distress.  HENT:  Head: Normocephalic and atraumatic.  Mouth/Throat: Oropharynx is clear and moist. No oropharyngeal exudate.  Eyes: Conjunctivae and EOM are normal. Pupils are equal, round,  and reactive to light. Right eye exhibits no discharge. Left eye exhibits no discharge. No scleral icterus.  Neck: Normal range of motion. Neck supple. No JVD present. No thyromegaly present.  Cardiovascular: Normal rate, regular rhythm, normal heart sounds and intact distal pulses.  Exam reveals no gallop and no friction rub.   No murmur heard. Pulmonary/Chest: Effort normal and breath sounds normal. No respiratory distress. She has no wheezes. She has no rales.  Abdominal: Soft. Bowel sounds are normal. She exhibits no distension and no mass. There is no tenderness.  CVA tenderness present on the left, very soft nontender abdomen  Musculoskeletal: Normal range of motion. She exhibits no edema and no tenderness.  Lymphadenopathy:    She has no cervical adenopathy.  Neurological: She is alert. Coordination normal.  Skin: Skin is warm and dry. No rash noted. No erythema.   Psychiatric: She has a normal mood and affect. Her behavior is normal.    ED Course  Procedures (including critical care time) Labs Review Labs Reviewed  URINALYSIS, ROUTINE W REFLEX MICROSCOPIC - Abnormal; Notable for the following:    Hgb urine dipstick TRACE (*)    All other components within normal limits  URINE MICROSCOPIC-ADD ON - Abnormal; Notable for the following:    Squamous Epithelial / LPF MANY (*)    Bacteria, UA MANY (*)    All other components within normal limits  URINE CULTURE   Imaging Review No results found.  EKG Interpretation   None       MDM   1. Flank pain    The patient does have some reproducible tenderness with percussion of her left costovertebral angle, vital signs are normal, urinalysis pending, likely kidney stone, would also consider muscular strain, urinary tract infection less likely. Toradol ordered  Discussed the findings with the patient, no hematuria, no significant infection, urine will be cultured, patient has had improvement with Toradol, possible kidney stone, conservative therapy and avoid radiation at this time, discussed with the patient who agrees with the plan. Urology followup.   Meds given in ED:  Medications  ketorolac (TORADOL) injection 60 mg (60 mg Intramuscular Given 05/22/13 0115)  ondansetron (ZOFRAN-ODT) disintegrating tablet 4 mg (4 mg Oral Given 05/22/13 0127)    New Prescriptions   NAPROXEN (NAPROSYN) 500 MG TABLET    Take 1 tablet (500 mg total) by mouth 2 (two) times daily with a meal.   PROMETHAZINE (PHENERGAN) 25 MG TABLET    Take 1 tablet (25 mg total) by mouth every 6 (six) hours as needed for nausea or vomiting.   TAMSULOSIN (FLOMAX) 0.4 MG CAPS CAPSULE    Take 1 capsule (0.4 mg total) by mouth 2 (two) times daily.      Vida Roller, MD 05/22/13 [phone]

## 2013-05-23 LAB — URINE CULTURE: Colony Count: 40000

## 2014-02-26 ENCOUNTER — Encounter (HOSPITAL_COMMUNITY): Payer: Self-pay | Admitting: Emergency Medicine

## 2014-02-26 ENCOUNTER — Emergency Department (HOSPITAL_COMMUNITY)
Admission: EM | Admit: 2014-02-26 | Discharge: 2014-02-26 | Disposition: A | Discharge status: Home / Self Care | Payer: Medicaid Other | Attending: Emergency Medicine | Admitting: Emergency Medicine

## 2014-02-26 DIAGNOSIS — Z720 Tobacco use: Secondary | ICD-10-CM | POA: Insufficient documentation

## 2014-02-26 DIAGNOSIS — Z8619 Personal history of other infectious and parasitic diseases: Secondary | ICD-10-CM | POA: Insufficient documentation

## 2014-02-26 DIAGNOSIS — Z880 Allergy status to penicillin: Secondary | ICD-10-CM | POA: Insufficient documentation

## 2014-02-26 DIAGNOSIS — N390 Urinary tract infection, site not specified: Secondary | ICD-10-CM | POA: Insufficient documentation

## 2014-02-26 DIAGNOSIS — Z3202 Encounter for pregnancy test, result negative: Secondary | ICD-10-CM | POA: Insufficient documentation

## 2014-02-26 DIAGNOSIS — Z79899 Other long term (current) drug therapy: Secondary | ICD-10-CM | POA: Insufficient documentation

## 2014-02-26 LAB — URINALYSIS, ROUTINE W REFLEX MICROSCOPIC
BILIRUBIN URINE: NEGATIVE
Glucose, UA: NEGATIVE mg/dL
KETONES UR: NEGATIVE mg/dL
NITRITE: POSITIVE — AB
PROTEIN: 100 mg/dL — AB
SPECIFIC GRAVITY, URINE: 1.02 (ref 1.005–1.030)
UROBILINOGEN UA: 0.2 mg/dL (ref 0.0–1.0)
pH: 6 (ref 5.0–8.0)

## 2014-02-26 LAB — URINE MICROSCOPIC-ADD ON

## 2014-02-26 LAB — PREGNANCY, URINE: Preg Test, Ur: NEGATIVE

## 2014-02-26 MED ORDER — CEPHALEXIN 500 MG PO CAPS: 500.0000 mg | ORAL_CAPSULE | Freq: Four times a day (QID) | ORAL | Status: DC

## 2014-02-26 MED ORDER — CEPHALEXIN 500 MG PO CAPS
500.0000 mg | ORAL_CAPSULE | Freq: Once | ORAL | Status: AC
  Administered 2014-02-26: 500 mg via ORAL
  Filled 2014-02-26: qty 1

## 2014-02-26 NOTE — ED Notes (Signed)
Pt complain of urinary pressure. States she was passing blood in her urine last night. OTC meds are not working

## 2014-02-26 NOTE — Discharge Instructions (Signed)

## 2014-02-26 NOTE — ED Notes (Signed)
Patient given discharge instruction, verbalized understand. Patient ambulatory out of the department.  

## 2014-02-28 LAB — URINE CULTURE

## 2014-03-01 NOTE — ED Provider Notes (Signed)
Medical screening examination/treatment/procedure(s) were performed by non-physician practitioner and as supervising physician I was immediately available for consultation/collaboration.   EKG Interpretation None       Kerston Landeck, MD 03/01/14 1046 

## 2014-03-01 NOTE — ED Provider Notes (Signed)
CSN: [id number]     Arrival date & time 02/26/14  1026 History   First MD Initiated Contact with Patient 02/26/14 1111     Chief Complaint  Patient presents with  . Urinary Retention     (Consider location/radiation/quality/duration/timing/severity/associated sxs/prior Treatment) HPI   Laura Andersen is a 23 y.o. female who presents to the Emergency Department complaining of pressure with urination and blood in her urine that began on the evening prior to ED arrival.  She states that she believes she has a UTI.  She has tried OTC medications w/o relief.  She denies fever, vomiting, back pain or chills.  Reports having previous UTI's and sx's feel similar to previous.    Past Medical History  Diagnosis Date  . Herpes    Past Surgical History  Procedure Laterality Date  . Dilation and curettage of uterus     No family history on file. History  Substance Use Topics  . Smoking status: Current Every Day Smoker -- 1.00 packs/day    Types: Cigarettes  . Smokeless tobacco: Not on file  . Alcohol Use: Yes   OB History   Grav Para Term Preterm Abortions TAB SAB Ect Mult Living                 Review of Systems  Constitutional: Negative for fever, chills, activity change and appetite change.  Respiratory: Negative for cough, chest tightness and shortness of breath.   Cardiovascular: Negative for chest pain.  Gastrointestinal: Negative for nausea, vomiting and abdominal pain.  Genitourinary: Positive for dysuria, urgency, frequency and hematuria. Negative for flank pain, vaginal bleeding, vaginal discharge, difficulty urinating and menstrual problem.  Musculoskeletal: Negative for back pain.  Skin: Negative for rash.  All other systems reviewed and are negative.     Allergies  Amoxicillin  Home Medications   Prior to Admission medications   Medication Sig Start Date End Date Taking? Authorizing Provider  Phenazopyridine HCl (AZO TABS PO) Take 2 tablets by mouth every  6 (six) hours as needed (urinary pain).   Yes Historical Provider, MD  valACYclovir (VALTREX) 1000 MG tablet Take 1,000 mg by mouth daily as needed.    Yes Historical Provider, MD  cephALEXin (KEFLEX) 500 MG capsule Take 1 capsule (500 mg total) by mouth 4 (four) times daily. For 7 days 02/26/14   Tyberius Ryner L. Kathlen Sakurai, PA-C   BP 139/80  Pulse 115  Temp(Src) 98.2 F (36.8 C) (Oral)  Resp 18  Ht 5\' 6"  (1.676 m)  Wt 151 lb (68.493 kg)  BMI 24.38 kg/m2  SpO2 100%  LMP 12/27/2013 Physical Exam  Nursing note and vitals reviewed. Constitutional: She is oriented to person, place, and time. She appears well-developed and well-nourished. No distress.  HENT:  Head: Normocephalic and atraumatic.  Neck: Normal range of motion. Neck supple.  Cardiovascular: Normal rate, regular rhythm, normal heart sounds and intact distal pulses.   No murmur heard. Pulmonary/Chest: Effort normal and breath sounds normal. No respiratory distress.  Abdominal: She exhibits no distension. There is no tenderness. There is no rebound and no guarding.  No CVA tenderness  Musculoskeletal: Normal range of motion.  Neurological: She is alert and oriented to person, place, and time. She exhibits normal muscle tone. Coordination normal.  Skin: Skin is warm and dry.    ED Course  Procedures (including critical care time) Labs Review Labs Reviewed  URINALYSIS, ROUTINE W REFLEX MICROSCOPIC - Abnormal; Notable for the following:    APPearance TURBID (*)  Hgb urine dipstick LARGE (*)    Protein, ur 100 (*)    Nitrite POSITIVE (*)    Leukocytes, UA MODERATE (*)    All other components within normal limits  URINE MICROSCOPIC-ADD ON - Abnormal; Notable for the following:    Bacteria, UA MANY (*)    All other components within normal limits  URINE CULTURE  PREGNANCY, URINE    Imaging Review No results found.   EKG Interpretation None      MDM   Final diagnoses:  Urinary tract infection without complication     Pt is well appearing, non-toxic.  No concerning sx';s for pyelonephritis.  Pt with UTI.  She agrees to pyridium and kelfex,  Urine culture pending.  She also agrees to return here if needed and appears stable for d/c    Ahnna Dungan L. Draden Cottingham, PA-C 03/01/14 [id number]

## 2014-03-03 ENCOUNTER — Telehealth (HOSPITAL_BASED_OUTPATIENT_CLINIC_OR_DEPARTMENT_OTHER): Payer: Self-pay

## 2014-03-03 NOTE — Telephone Encounter (Signed)
Post ED Visit - Positive Culture Follow-up  Culture report reviewed by antimicrobial stewardship pharmacist: []  Laura Andersen, Pharm.D., BCPS $[email or web address][]  Laura Andersen, Pharm.D., BCPS []  Laura Andersen, [place] BCPS, AAHIVP [x]  Laura Andersen, Pharm.D., BCPS, AAHIVP []  Laura Andersen, Pharm.D. []  Laura Andersen, Pharm.D.  Positive Urine culture Treated with Cephalexin, organism sensitive to the same and no further patient follow-up is required at this time.  Laura Andersen, Laura Andersen 03/03/2014, 5:25 AM

## 2014-10-28 ENCOUNTER — Encounter (HOSPITAL_COMMUNITY): Payer: Self-pay | Admitting: Emergency Medicine

## 2014-10-28 ENCOUNTER — Emergency Department (HOSPITAL_COMMUNITY)
Admission: EM | Admit: 2014-10-28 | Discharge: 2014-10-28 | Disposition: A | Discharge status: Home / Self Care | Payer: Medicaid Other | Attending: Emergency Medicine | Admitting: Emergency Medicine

## 2014-10-28 DIAGNOSIS — N898 Other specified noninflammatory disorders of vagina: Secondary | ICD-10-CM | POA: Diagnosis not present

## 2014-10-28 DIAGNOSIS — Z8719 Personal history of other diseases of the digestive system: Secondary | ICD-10-CM | POA: Diagnosis not present

## 2014-10-28 DIAGNOSIS — R000 Tachycardia, unspecified: Secondary | ICD-10-CM | POA: Diagnosis not present

## 2014-10-28 DIAGNOSIS — Z87891 Personal history of nicotine dependence: Secondary | ICD-10-CM | POA: Insufficient documentation

## 2014-10-28 DIAGNOSIS — Z880 Allergy status to penicillin: Secondary | ICD-10-CM | POA: Insufficient documentation

## 2014-10-28 DIAGNOSIS — R300 Dysuria: Secondary | ICD-10-CM | POA: Diagnosis present

## 2014-10-28 DIAGNOSIS — N390 Urinary tract infection, site not specified: Secondary | ICD-10-CM | POA: Insufficient documentation

## 2014-10-28 DIAGNOSIS — Z9889 Other specified postprocedural states: Secondary | ICD-10-CM | POA: Diagnosis not present

## 2014-10-28 LAB — URINALYSIS, ROUTINE W REFLEX MICROSCOPIC
Bilirubin Urine: NEGATIVE
GLUCOSE, UA: NEGATIVE mg/dL
Hgb urine dipstick: NEGATIVE
Ketones, ur: NEGATIVE mg/dL
NITRITE: NEGATIVE
PH: 7 (ref 5.0–8.0)
PROTEIN: NEGATIVE mg/dL
SPECIFIC GRAVITY, URINE: 1.02 (ref 1.005–1.030)
UROBILINOGEN UA: 0.2 mg/dL (ref 0.0–1.0)

## 2014-10-28 LAB — URINE MICROSCOPIC-ADD ON

## 2014-10-28 MED ORDER — ONDANSETRON HCL 4 MG PO TABS
4.0000 mg | ORAL_TABLET | Freq: Once | ORAL | Status: AC
  Administered 2014-10-28: 4 mg via ORAL
  Filled 2014-10-28: qty 1

## 2014-10-28 MED ORDER — PHENAZOPYRIDINE HCL 100 MG PO TABS: 100.0000 mg | ORAL_TABLET | Freq: Three times a day (TID) | ORAL | Status: DC | PRN

## 2014-10-28 MED ORDER — FLUCONAZOLE 100 MG PO TABS
100.0000 mg | ORAL_TABLET | Freq: Once | ORAL | Status: AC
  Administered 2014-10-28: 100 mg via ORAL
  Filled 2014-10-28: qty 1

## 2014-10-28 MED ORDER — CEPHALEXIN 500 MG PO CAPS: 500.0000 mg | ORAL_CAPSULE | Freq: Four times a day (QID) | ORAL | Status: DC

## 2014-10-28 MED ORDER — CEPHALEXIN 500 MG PO CAPS
500.0000 mg | ORAL_CAPSULE | Freq: Once | ORAL | Status: AC
  Administered 2014-10-28: 500 mg via ORAL
  Filled 2014-10-28: qty 1

## 2014-10-28 MED ORDER — PHENAZOPYRIDINE HCL 100 MG PO TABS
100.0000 mg | ORAL_TABLET | Freq: Once | ORAL | Status: AC
  Administered 2014-10-28: 100 mg via ORAL
  Filled 2014-10-28: qty 1

## 2014-10-28 NOTE — ED Notes (Signed)
PT c/o increased urinary frequency with burning x2 days. PT denies any fever of flank pain. PT states hx of UTI.

## 2014-10-28 NOTE — Discharge Instructions (Signed)
Please increase water and juices. Please use Keflex 4 times daily with food, use Pyridium 3 times daily with food. Pyridium may cause the urine to change colors. Please see your physicians at the health department in 7-10 days for recheck of your urine. Urinary Tract Infection Urinary tract infections (UTIs) can develop anywhere along your urinary tract. Your urinary tract is your body's drainage system for removing wastes and extra water. Your urinary tract includes two kidneys, two ureters, a bladder, and a urethra. Your kidneys are a pair of bean-shaped organs. Each kidney is about the size of your fist. They are located below your ribs, one on each side of your spine. CAUSES Infections are caused by microbes, which are microscopic organisms, including fungi, viruses, and bacteria. These organisms are so small that they can only be seen through a microscope. Bacteria are the microbes that most commonly cause UTIs. SYMPTOMS  Symptoms of UTIs may vary by age and gender of the patient and by the location of the infection. Symptoms in young women typically include a frequent and intense urge to urinate and a painful, burning feeling in the bladder or urethra during urination. Older women and men are more likely to be tired, shaky, and weak and have muscle aches and abdominal pain. A fever may mean the infection is in your kidneys. Other symptoms of a kidney infection include pain in your back or sides below the ribs, nausea, and vomiting. DIAGNOSIS To diagnose a UTI, your caregiver will ask you about your symptoms. Your caregiver also will ask to provide a urine sample. The urine sample will be tested for bacteria and white blood cells. White blood cells are made by your body to help fight infection. TREATMENT  Typically, UTIs can be treated with medication. Because most UTIs are caused by a bacterial infection, they usually can be treated with the use of antibiotics. The choice of antibiotic and length of  treatment depend on your symptoms and the type of bacteria causing your infection. HOME CARE INSTRUCTIONS  If you were prescribed antibiotics, take them exactly as your caregiver instructs you. Finish the medication even if you feel better after you have only taken some of the medication.  Drink enough water and fluids to keep your urine clear or pale yellow.  Avoid caffeine, tea, and carbonated beverages. They tend to irritate your bladder.  Empty your bladder often. Avoid holding urine for long periods of time.  Empty your bladder before and after sexual intercourse.  After a bowel movement, women should cleanse from front to back. Use each tissue only once. SEEK MEDICAL CARE IF:   You have back pain.  You develop a fever.  Your symptoms do not begin to resolve within 3 days. SEEK IMMEDIATE MEDICAL CARE IF:   You have severe back pain or lower abdominal pain.  You develop chills.  You have nausea or vomiting.  You have continued burning or discomfort with urination. MAKE SURE YOU:   Understand these instructions.  Will watch your condition.  Will get help right away if you are not doing well or get worse. Document Released: 02/16/2005 Document Revised: 11/08/2011 Document Reviewed: 06/17/2011 [facility] Patient Information 2015 [place], [place]. This information is not intended to replace advice given to you by your health care provider. Make sure you discuss any questions you have with your health care provider.

## 2014-10-28 NOTE — ED Provider Notes (Signed)
CSN: [id number]     Arrival date & time 10/28/14  1458 History   First MD Initiated Contact with Patient 10/28/14 1524     Chief Complaint  Patient presents with  . Urinary Tract Infection     (Consider location/radiation/quality/duration/timing/severity/associated sxs/prior Treatment) Patient is a 24 y.o. female presenting with urinary tract infection. The history is provided by the patient.  Urinary Tract Infection Quality: heaviness. Pain severity:  Moderate Onset quality:  Gradual Duration:  2 days Timing:  Intermittent Progression:  Worsening Chronicity:  New Relieved by:  Nothing Worsened by:  Nothing tried Urinary symptoms: frequent urination   Urinary symptoms: no hematuria   Associated symptoms: vaginal discharge   Associated symptoms: no fever, no nausea and no vomiting   Risk factors: sexually active     Past Medical History  Diagnosis Date  . Herpes    Past Surgical History  Procedure Laterality Date  . Dilation and curettage of uterus     History reviewed. No pertinent family history. History  Substance Use Topics  . Smoking status: Former Smoker -- 1.00 packs/day    Types: Cigarettes    Quit date: 10/14/2014  . Smokeless tobacco: Not on file  . Alcohol Use: Yes   OB History    Gravida Para Term Preterm AB TAB SAB Ectopic Multiple Living            2     Review of Systems  Constitutional: Negative for fever.  Gastrointestinal: Negative for nausea and vomiting.  Genitourinary: Positive for dysuria, frequency and vaginal discharge.  All other systems reviewed and are negative.     Allergies  Amoxicillin  Home Medications   Prior to Admission medications   Medication Sig Start Date End Date Taking? Authorizing Provider  cephALEXin (KEFLEX) 500 MG capsule Take 1 capsule (500 mg total) by mouth 4 (four) times daily. 10/28/14   Ivery Quale, PA-C  phenazopyridine (PYRIDIUM) 100 MG tablet Take 1 tablet (100 mg total) by mouth 3 (three) times  daily as needed for pain. 10/28/14   Ivery Quale, PA-C   BP 141/89 mmHg  Pulse 105  Temp(Src) 99 F (37.2 C) (Oral)  Resp 18  Ht  (1.676 m)  Wt 180 lb 9 oz (81.903 kg)  BMI 29.16 kg/m2  SpO2 99% Physical Exam  Constitutional: She is oriented to person, place, and time. She appears well-developed and well-nourished.  Non-toxic appearance.  HENT:  Head: Normocephalic.  Right Ear: Tympanic membrane and external ear normal.  Left Ear: Tympanic membrane and external ear normal.  Eyes: EOM and lids are normal. Pupils are equal, round, and reactive to light.  Neck: Normal range of motion. Neck supple. Carotid bruit is not present.  Cardiovascular: Regular rhythm, normal heart sounds, intact distal pulses and normal pulses.  Tachycardia present.   Pulmonary/Chest: Breath sounds normal. No respiratory distress.  Abdominal: Soft. Bowel sounds are normal. There is no tenderness. There is no guarding.  No CVAT  Musculoskeletal: Normal range of motion.  Lymphadenopathy:       Head (right side): No submandibular adenopathy present.       Head (left side): No submandibular adenopathy present.    She has no cervical adenopathy.  Neurological: She is alert and oriented to person, place, and time. She has normal strength. No cranial nerve deficit or sensory deficit.  Skin: Skin is warm and dry.  Psychiatric: She has a normal mood and affect. Her speech is normal.  Nursing note and vitals reviewed.  ED Course  Procedures (including critical care time) Labs Review Labs Reviewed  URINALYSIS, ROUTINE W REFLEX MICROSCOPIC ([facility]) - Abnormal; Notable for the following:    Leukocytes, UA TRACE (*)    All other components within normal limits  URINE MICROSCOPIC-ADD ON - Abnormal; Notable for the following:    Squamous Epithelial / LPF FEW (*)    Bacteria, UA MANY (*)    All other components within normal limits  URINE CULTURE    Imaging Review No results found.   EKG  Interpretation None      MDM  Temperature 90.9, pulse rate 105, elevated, the remainder the vital signs are well within normal limits. The urine analysis is consistent with urinary tract infection. A culture has been sent to the lab. The patient does not have symptoms consistent with pyelonephritis at this time.  Patient will be treated with Keflex 4 times a day and Pyridium. The patient will see her physicians at the health department for recheck of her urine and 7-10 days. Patient will return to the emergency department if any changes, problems, or concerns.    Final diagnoses:  UTI (lower urinary tract infection)    **I have reviewed nursing notes, vital signs, and all appropriate lab and imaging results for this patient.Ivery Quale*    Esten Dollar, PA-C 10/28/14 1654  Gilda Creasehristopher J Pollina, MD 11/03/14 [phone]

## 2014-10-28 NOTE — ED Notes (Signed)
Pt says white d/c for 2-3 days, dysuria, No NVD. No fever.

## 2014-10-31 LAB — URINE CULTURE: Colony Count: 50000

## 2014-11-01 ENCOUNTER — Telehealth: Payer: Self-pay | Admitting: Emergency Medicine

## 2014-11-01 NOTE — Progress Notes (Signed)
ED Antimicrobial Stewardship Positive Culture Follow Up   Laura Andersen is an 24 y.o. female who presented to [facility] on 10/28/2014 with a chief complaint of dysuria and vaginal discharge x 2 days.  ED provider noted, "symptoms not consistent with pyelo" and patient was discharged w/ Rx for Keflex for UTI.  Chief Complaint  Patient presents with  . Urinary Tract Infection    Recent Results (from the past 720 hour(s))  Urine culture     Status: None   Collection Time: 10/28/14  3:05 PM  Result Value Ref Range Status   Specimen Description URINE, CLEAN CATCH  Final   Special Requests NONE  Final   Colony Count   Final    50,000 COLONIES/ML Performed at [facility]    Culture   Final    ESCHERICHIA COLI Performed at [facility]    Report Status 10/31/2014 FINAL  Final   Organism ID, Bacteria ESCHERICHIA COLI  Final      Susceptibility   Escherichia coli - MIC*    AMPICILLIN >=32 RESISTANT Resistant     CEFAZOLIN 16 INTERMEDIATE Intermediate     CEFTRIAXONE <=1 SENSITIVE Sensitive     CIPROFLOXACIN 0.5 SENSITIVE Sensitive     GENTAMICIN <=1 SENSITIVE Sensitive     LEVOFLOXACIN 1 SENSITIVE Sensitive     NITROFURANTOIN <=16 SENSITIVE Sensitive     TOBRAMYCIN <=1 SENSITIVE Sensitive     TRIMETH/SULFA <=20 SENSITIVE Sensitive     PIP/TAZO <=4 SENSITIVE Sensitive     * ESCHERICHIA COLI    [x]  Treated with Keflex, organism resistant to prescribed antimicrobial   New antibiotic prescription: Stop Keflex Start Bactrim DS 1 tab po BID x 3 days  ED Provider: Dierdre Forth, PA-C   Sharrieff Spratlin K 11/01/2014, 10:53 AM Infectious Diseases Pharmacist Phone# [phone]

## 2014-11-02 ENCOUNTER — Telehealth: Payer: Self-pay | Admitting: Emergency Medicine

## 2014-11-15 ENCOUNTER — Telehealth: Payer: Self-pay | Admitting: Emergency Medicine

## 2014-11-15 NOTE — Telephone Encounter (Signed)
Post ED Visit - Positive Culture Follow-up: Successful Patient Follow-Up  Culture assessed and recommendations reviewed by: []  Celedonio Miyamoto, Pharm.D., BCPS-AQ ID []  Georgina Pillion, Pharm.D., BCPS []  [place], [address].D., BCPS, AAHIVP []  Estella Husk, Pharm.D., BCPS, AAHIVP [x]  Tegan Magsam, Pharm.D. []  Tennis Must, [place].D.  Positive Urine culture  []  Patient discharged without antimicrobial prescription and treatment is now indicated [x]  Organism is resistant to prescribed ED discharge antimicrobial []  Patient with positive blood cultures  Changes discussed with ED provider: Dierdre Forth, PA New antibiotic prescription: Bactrim DS one PO BID x three days Called to [facility] [phone]  Contacted patient, date 11/15/14, time 1702 ID verified, patient called after receiving letter. Notified of + urine culture and need for treatment. RX Bactrim called to [facility] [phone]   Jiles Harold 11/15/2014, 5:31 PM

## 2015-10-26 ENCOUNTER — Other Ambulatory Visit: Payer: Self-pay | Admitting: Obstetrics & Gynecology

## 2015-10-28 ENCOUNTER — Other Ambulatory Visit (HOSPITAL_COMMUNITY)
Admission: RE | Admit: 2015-10-28 | Discharge: 2015-10-28 | Disposition: A | Discharge status: Home / Self Care | Payer: Medicaid Other | Source: Ambulatory Visit | Attending: Obstetrics & Gynecology | Admitting: Obstetrics & Gynecology

## 2015-10-28 ENCOUNTER — Ambulatory Visit (INDEPENDENT_AMBULATORY_CARE_PROVIDER_SITE_OTHER): Payer: Medicaid Other | Admitting: Obstetrics & Gynecology

## 2015-10-28 ENCOUNTER — Encounter: Payer: Self-pay | Admitting: Obstetrics & Gynecology

## 2015-10-28 DIAGNOSIS — Z113 Encounter for screening for infections with a predominantly sexual mode of transmission: Secondary | ICD-10-CM | POA: Insufficient documentation

## 2015-10-28 DIAGNOSIS — Z01419 Encounter for gynecological examination (general) (routine) without abnormal findings: Secondary | ICD-10-CM | POA: Diagnosis present

## 2015-10-28 DIAGNOSIS — Z0000 Encounter for general adult medical examination without abnormal findings: Secondary | ICD-10-CM

## 2015-10-28 NOTE — Progress Notes (Signed)
Patient ID: Laura Andersen, female   DOB: 08-23-1990, 25 y.o.   MRN: [id number] Subjective:     Laura Andersen is a 25 y.o. female here for a routine exam.  No LMP recorded. Patient has had an implant. No obstetric history on file. Birth Control Method:  nexplanon Menstrual Calendar(currently): amenorrhiec  Current complaints: nexplanon.   Current acute medical issues:  none   Recent Gynecologic History No LMP recorded. Patient has had an implant. Last Pap: 2016,  normal Last mammogram: ,    Past Medical History  Diagnosis Date  . Herpes     Past Surgical History  Procedure Laterality Date  . Dilation and curettage of uterus      OB History    Gravida Para Term Preterm AB TAB SAB Ectopic Multiple Living            2      Social History   Social History  . Marital Status: Single    Spouse Name: N/A  . Number of Children: N/A  . Years of Education: N/A   Social History Main Topics  . Smoking status: Former Smoker -- 1.00 packs/day    Types: Cigarettes    Quit date: 10/14/2014  . Smokeless tobacco: None  . Alcohol Use: Yes  . Drug Use: No  . Sexual Activity: Yes    Birth Control/ Protection: None   Other Topics Concern  . None   Social History Narrative    History reviewed. No pertinent family history.  No current outpatient prescriptions on file.  Review of Systems  Review of Systems  Constitutional: Negative for fever, chills, weight loss, malaise/fatigue and diaphoresis.  HENT: Negative for hearing loss, ear pain, nosebleeds, congestion, sore throat, neck pain, tinnitus and ear discharge.   Eyes: Negative for blurred vision, double vision, photophobia, pain, discharge and redness.  Respiratory: Negative for cough, hemoptysis, sputum production, shortness of breath, wheezing and stridor.   Cardiovascular: Negative for chest pain, palpitations, orthopnea, claudication, leg swelling and PND.  Gastrointestinal: negative for abdominal pain.  Negative for heartburn, nausea, vomiting, diarrhea, constipation, blood in stool and melena.  Genitourinary: Negative for dysuria, urgency, frequency, hematuria and flank pain.  Musculoskeletal: Negative for myalgias, back pain, joint pain and falls.  Skin: Negative for itching and rash.  Neurological: Negative for dizziness, tingling, tremors, sensory change, speech change, focal weakness, seizures, loss of consciousness, weakness and headaches.  Endo/Heme/Allergies: Negative for environmental allergies and polydipsia. Does not bruise/bleed easily.  Psychiatric/Behavioral: Negative for depression, suicidal ideas, hallucinations, memory loss and substance abuse. The patient is not nervous/anxious and does not have insomnia.        Objective:  Blood pressure 130/80, pulse 78, height  (1.676 m), weight 190 lb (86.183 kg).   Physical Exam  Vitals reviewed. Constitutional: She is oriented to person, place, and time. She appears well-developed and well-nourished.  HENT:  Head: Normocephalic and atraumatic.        Right Ear: External ear normal.  Left Ear: External ear normal.  Nose: Nose normal.  Mouth/Throat: Oropharynx is clear and moist.  Eyes: Conjunctivae and EOM are normal. Pupils are equal, round, and reactive to light. Right eye exhibits no discharge. Left eye exhibits no discharge. No scleral icterus.  Neck: Normal range of motion. Neck supple. No tracheal deviation present. No thyromegaly present.  Cardiovascular: Normal rate, regular rhythm, normal heart sounds and intact distal pulses.  Exam reveals no gallop and no friction rub.   No murmur heard.  Respiratory: Effort normal and breath sounds normal. No respiratory distress. She has no wheezes. She has no rales. She exhibits no tenderness.  GI: Soft. Bowel sounds are normal. She exhibits no distension and no mass. There is no tenderness. There is no rebound and no guarding.  Genitourinary:  Breasts no masses skin changes or  nipple changes bilaterally      Vulva is normal without lesions Vagina is pink moist without discharge Cervix normal in appearance and pap is done Uterus is normal size shape and contour Adnexa is negative with normal sized ovaries   Musculoskeletal: Normal range of motion. She exhibits no edema and no tenderness.  Neurological: She is alert and oriented to person, place, and time. She has normal reflexes. She displays normal reflexes. No cranial nerve deficit. She exhibits normal muscle tone. Coordination normal.  Skin: Skin is warm and dry. No rash noted. No erythema. No pallor.  Psychiatric: She has a normal mood and affect. Her behavior is normal. Judgment and thought content normal.       Medications Ordered at today's visit: No orders of the defined types were placed in this encounter.    Other orders placed at today's visit: No orders of the defined types were placed in this encounter.      Assessment:    Healthy female exam.    Plan:    Contraception: Nexplanon. Follow up in: 1 year.     Return in about 13 months (around 11/26/2016) for yearly, with Dr Despina HiddenEure, enexplanon removal.

## 2015-10-30 LAB — CYTOLOGY - PAP

## 2016-03-17 ENCOUNTER — Encounter: Payer: Self-pay | Admitting: Advanced Practice Midwife

## 2016-03-17 ENCOUNTER — Ambulatory Visit (INDEPENDENT_AMBULATORY_CARE_PROVIDER_SITE_OTHER): Payer: Medicaid Other | Admitting: Advanced Practice Midwife

## 2016-03-17 VITALS — BP 132/80 | HR 76 | Wt 199.0 lb

## 2016-03-17 DIAGNOSIS — Z113 Encounter for screening for infections with a predominantly sexual mode of transmission: Secondary | ICD-10-CM | POA: Diagnosis not present

## 2016-03-17 DIAGNOSIS — Z3046 Encounter for surveillance of implantable subdermal contraceptive: Secondary | ICD-10-CM

## 2016-03-17 DIAGNOSIS — A599 Trichomoniasis, unspecified: Secondary | ICD-10-CM

## 2016-03-17 DIAGNOSIS — Z308 Encounter for other contraceptive management: Secondary | ICD-10-CM

## 2016-03-17 DIAGNOSIS — A5901 Trichomonal vulvovaginitis: Secondary | ICD-10-CM | POA: Diagnosis not present

## 2016-03-17 MED ORDER — METRONIDAZOLE 500 MG PO TABS: 500.0000 mg | ORAL_TABLET | Freq: Two times a day (BID) | ORAL | 0 refills | Status: DC

## 2016-03-17 NOTE — Patient Instructions (Signed)
Trichomoniasis Trichomoniasis is an infection caused by an organism called Trichomonas. The infection can affect both women and men. In women, the outer female genitalia and the vagina are affected. In men, the penis is mainly affected, but the prostate and other reproductive organs can also be involved. Trichomoniasis is a sexually transmitted infection (STI) and is most often passed to another person through sexual contact.  RISK FACTORS  Having unprotected sexual intercourse.  Having sexual intercourse with an infected partner. SIGNS AND SYMPTOMS  Symptoms of trichomoniasis in women include:  Abnormal gray-green frothy vaginal discharge.  Itching and irritation of the vagina.  Itching and irritation of the area outside the vagina. Symptoms of trichomoniasis in men include:   Penile discharge with or without pain.  Pain during urination. This results from inflammation of the urethra. DIAGNOSIS  Trichomoniasis may be found during a Pap test or physical exam. Your health care provider may use one of the following methods to help diagnose this infection:  Testing the pH of the vagina with a test tape.  Using a vaginal swab test that checks for the Trichomonas organism. A test is available that provides results within a few minutes.  Examining a urine sample.  Testing vaginal secretions. Your health care provider may test you for other STIs, including HIV. TREATMENT   You may be given medicine to fight the infection. Women should inform their health care provider if they could be or are pregnant. Some medicines used to treat the infection should not be taken during pregnancy.  Your health care provider may recommend over-the-counter medicines or creams to decrease itching or irritation.  Your sexual partner will need to be treated if infected.  Your health care provider may test you for infection again 3 months after treatment. HOME CARE INSTRUCTIONS   Take medicines only as  directed by your health care provider.  Take over-the-counter medicine for itching or irritation as directed by your health care provider.  Do not have sexual intercourse while you have the infection.  Women should not douche or wear tampons while they have the infection.  Discuss your infection with your partner. Your partner may have gotten the infection from you, or you may have gotten it from your partner.  Have your sex partner get examined and treated if necessary.  Practice safe, informed, and protected sex.  See your health care provider for other STI testing. SEEK MEDICAL CARE IF:   You still have symptoms after you finish your medicine.  You develop abdominal pain.  You have pain when you urinate.  You have bleeding after sexual intercourse.  You develop a rash.  Your medicine makes you sick or makes you throw up (vomit). MAKE SURE YOU:  Understand these instructions.  Will watch your condition.  Will get help right away if you are not doing well or get worse.   This information is not intended to replace advice given to you by your health care provider. Make sure you discuss any questions you have with your health care provider.   Document Released: 11/02/2000 Document Revised: 05/30/2014 Document Reviewed: 02/18/2013 Elsevier Interactive Patient Education 2016 Elsevier Inc.  

## 2016-03-17 NOTE — Progress Notes (Signed)
[facility] Visit  Patient name: Laura Andersen MRN [id number]  Date of birth: 1990-11-01  CC & HPI:  Laura Andersen is a 25 y.o.  female presenting today to dis/suss birth control and STD testing.  Boyfriend's ex has trich  He denies being with her, but he went to ED yesterday and was given rx for tx Nexplanon for contraception. Is doing fine with it, due to come out next year  Pertinent History Reviewed:  Medical & Surgical Hx:   Past Medical History:  Diagnosis Date  . Herpes    Past Surgical History:  Procedure Laterality Date  . DILATION AND CURETTAGE OF UTERUS     History reviewed. No pertinent family history.  Current Outpatient Prescriptions:  .  metroNIDAZOLE (FLAGYL) 500 MG tablet, Take 1 tablet (500 mg total) by mouth 2 (two) times daily., Disp: 14 tablet, Rfl: 0 Social History: Reviewed -  reports that she quit smoking about 17 months ago. Her smoking use included Cigarettes. She smoked 1.00 pack per day. She has never used smokeless tobacco.  Review of Systems:   Constitutional: Negative for fever and chills Eyes: Negative for visual disturbances Respiratory: Negative for shortness of breath, dyspnea Cardiovascular: Negative for chest pain or palpitations  Gastrointestinal: Negative for vomiting, diarrhea and constipation; no abdominal pain Genitourinary: Negative for dysuria and urgency, vaginal irritation or itching Musculoskeletal: Negative for back pain, joint pain, myalgias  Neurological: Negative for dizziness and headaches    Objective Findings:    Physical Examination: General appearance - well appearing, and in no distress Mental status - alert, oriented to person, place, and time Chest:  Normal respiratory effort Heart - normal rate and regular rhythm Abdomen:  Soft, nontender Pelvic: SSE:  Thin yellow discharge w/o odor.  Wet prep + trich, WBC Musculoskeletal:  Normal range of motion without pain Extremities:  No  edema    No results found for this or any previous visit (from the past 24 hour(s)).    Assessment & Plan:  A:    Meds ordered this encounter  Medications  . metroNIDAZOLE (FLAGYL) 500 MG tablet    Sig: Take 1 tablet (500 mg total) by mouth 2 (two) times daily.    Dispense:  14 tablet    Refill:  0    Order Specific Question:   Supervising Provider    Answer:   Despina HiddenEURE, LUTHER H [[id number]]    P:    Return if symptoms worsen or fail to improve.  CRESENZO-DISHMAN,Tavarion Babington CNM 03/17/2016 11:52 AM

## 2016-11-24 ENCOUNTER — Ambulatory Visit (INDEPENDENT_AMBULATORY_CARE_PROVIDER_SITE_OTHER): Payer: Medicaid Other | Admitting: Obstetrics & Gynecology

## 2016-11-24 ENCOUNTER — Other Ambulatory Visit (HOSPITAL_COMMUNITY)
Admission: RE | Admit: 2016-11-24 | Discharge: 2016-11-24 | Disposition: A | Discharge status: Home / Self Care | Payer: Medicaid Other | Source: Ambulatory Visit | Attending: Obstetrics & Gynecology | Admitting: Obstetrics & Gynecology

## 2016-11-24 ENCOUNTER — Encounter: Payer: Self-pay | Admitting: Obstetrics & Gynecology

## 2016-11-24 VITALS — BP 110/64 | HR 98 | Ht 66.0 in | Wt 203.0 lb

## 2016-11-24 DIAGNOSIS — Z3009 Encounter for other general counseling and advice on contraception: Secondary | ICD-10-CM

## 2016-11-24 DIAGNOSIS — Z308 Encounter for other contraceptive management: Secondary | ICD-10-CM

## 2016-11-24 NOTE — Addendum Note (Signed)
Addended by: Tish FredericksonLANCASTER, Majour Frei A on: 11/24/2016 11:51 AM   Modules accepted: Orders

## 2016-11-24 NOTE — Progress Notes (Signed)
Subjective:     Laura Andersen is a 26 y.o. female here for a routine exam.  No LMP recorded. Patient has had an implant. No obstetric history on file. Birth Control Method:  nexplanon Menstrual Calendar(currently): amenorrheic on nexplanon  Current complaints: none.   Current acute medical issues:  none   Recent Gynecologic History No LMP recorded. Patient has had an implant. Last Pap: 10/28/2015,  normal Last mammogram: ,    Past Medical History:  Diagnosis Date  . Herpes     Past Surgical History:  Procedure Laterality Date  . DILATION AND CURETTAGE OF UTERUS      OB History    Gravida Para Term Preterm AB Living             2   SAB TAB Ectopic Multiple Live Births                  Social History   Social History  . Marital status: Single    Spouse name: N/A  . Number of children: N/A  . Years of education: N/A   Social History Main Topics  . Smoking status: Former Smoker    Packs/day: 1.00    Types: Cigarettes    Quit date: 10/14/2014  . Smokeless tobacco: Never Used  . Alcohol use Yes  . Drug use: No  . Sexual activity: Yes    Birth control/ protection: None, Implant   Other Topics Concern  . None   Social History Narrative  . None    History reviewed. No pertinent family history.  No current outpatient prescriptions on file.  Review of Systems  Review of Systems  Constitutional: Negative for fever, chills, weight loss, malaise/fatigue and diaphoresis.  HENT: Negative for hearing loss, ear pain, nosebleeds, congestion, sore throat, neck pain, tinnitus and ear discharge.   Eyes: Negative for blurred vision, double vision, photophobia, pain, discharge and redness.  Respiratory: Negative for cough, hemoptysis, sputum production, shortness of breath, wheezing and stridor.   Cardiovascular: Negative for chest pain, palpitations, orthopnea, claudication, leg swelling and PND.  Gastrointestinal: negative for abdominal pain. Negative for  heartburn, nausea, vomiting, diarrhea, constipation, blood in stool and melena.  Genitourinary: Negative for dysuria, urgency, frequency, hematuria and flank pain.  Musculoskeletal: Negative for myalgias, back pain, joint pain and falls.  Skin: Negative for itching and rash.  Neurological: Negative for dizziness, tingling, tremors, sensory change, speech change, focal weakness, seizures, loss of consciousness, weakness and headaches.  Endo/Heme/Allergies: Negative for environmental allergies and polydipsia. Does not bruise/bleed easily.  Psychiatric/Behavioral: Negative for depression, suicidal ideas, hallucinations, memory loss and substance abuse. The patient is not nervous/anxious and does not have insomnia.        Objective:  Blood pressure 110/64, pulse 98, height 5\' 6"  (1.676 m), weight 203 lb (92.1 kg).   Physical Exam  Vitals reviewed. Constitutional: She is oriented to person, place, and time. She appears well-developed and well-nourished.  HENT:  Head: Normocephalic and atraumatic.        Right Ear: External ear normal.  Left Ear: External ear normal.  Nose: Nose normal.  Mouth/Throat: Oropharynx is clear and moist.  Eyes: Conjunctivae and EOM are normal. Pupils are equal, round, and reactive to light. Right eye exhibits no discharge. Left eye exhibits no discharge. No scleral icterus.  Neck: Normal range of motion. Neck supple. No tracheal deviation present. No thyromegaly present.  Cardiovascular: Normal rate, regular rhythm, normal heart sounds and intact distal pulses.  Exam reveals no  gallop and no friction rub.   No murmur heard. Respiratory: Effort normal and breath sounds normal. No respiratory distress. She has no wheezes. She has no rales. She exhibits no tenderness.  GI: Soft. Bowel sounds are normal. She exhibits no distension and no mass. There is no tenderness. There is no rebound and no guarding.  Genitourinary:  Breasts no masses skin changes or nipple changes  bilaterally      Vulva is normal without lesions Vagina is pink moist without discharge Cervix normal in appearance and pap is done Uterus is normal size shape and contour Adnexa is negative with normal sized ovaries   Musculoskeletal: Normal range of motion. She exhibits no edema and no tenderness.  Neurological: She is alert and oriented to person, place, and time. She has normal reflexes. She displays normal reflexes. No cranial nerve deficit. She exhibits normal muscle tone. Coordination normal.  Skin: Skin is warm and dry. No rash noted. No erythema. No pallor.  Psychiatric: She has a normal mood and affect. Her behavior is normal. Judgment and thought content normal.       Medications Ordered at today's visit: No orders of the defined types were placed in this encounter.   Other orders placed at today's visit: No orders of the defined types were placed in this encounter.     Assessment:    Healthy female exam.   Contraceptive management: Nexplanon, needs removal/replacement Plan:    Contraception: Nexplanon. Follow up in: 3 weeks.   Plan to order nexplanon and remove current and replace  Return in about 3 weeks (around 12/15/2016) for nexplanon removal reinsertion, with Dr Despina HiddenEure.

## 2016-11-25 LAB — CYTOLOGY - PAP
Chlamydia: NEGATIVE
Diagnosis: NEGATIVE
HPV: NOT DETECTED
Neisseria Gonorrhea: NEGATIVE

## 2016-11-25 LAB — RPR: RPR: NONREACTIVE

## 2016-11-25 LAB — HIV ANTIBODY (ROUTINE TESTING W REFLEX): HIV Screen 4th Generation wRfx: NONREACTIVE

## 2016-12-15 ENCOUNTER — Encounter: Payer: Self-pay | Admitting: Women's Health

## 2016-12-15 ENCOUNTER — Ambulatory Visit (INDEPENDENT_AMBULATORY_CARE_PROVIDER_SITE_OTHER): Payer: Medicaid Other | Admitting: Women's Health

## 2016-12-15 ENCOUNTER — Encounter: Payer: Medicaid Other | Admitting: Advanced Practice Midwife

## 2016-12-15 ENCOUNTER — Encounter: Payer: Medicaid Other | Admitting: Obstetrics & Gynecology

## 2016-12-15 VITALS — BP 120/80 | HR 84 | Wt 203.0 lb

## 2016-12-15 DIAGNOSIS — Z30017 Encounter for initial prescription of implantable subdermal contraceptive: Secondary | ICD-10-CM

## 2016-12-15 DIAGNOSIS — Z3049 Encounter for surveillance of other contraceptives: Secondary | ICD-10-CM

## 2016-12-15 DIAGNOSIS — Z3202 Encounter for pregnancy test, result negative: Secondary | ICD-10-CM

## 2016-12-15 DIAGNOSIS — Z3046 Encounter for surveillance of implantable subdermal contraceptive: Secondary | ICD-10-CM

## 2016-12-15 LAB — POCT URINE PREGNANCY: Preg Test, Ur: NEGATIVE

## 2016-12-15 MED ORDER — ETONOGESTREL 68 MG ~~LOC~~ IMPL
68.0000 mg | DRUG_IMPLANT | Freq: Once | SUBCUTANEOUS | Status: AC
  Administered 2016-12-15: 68 mg via SUBCUTANEOUS

## 2016-12-15 NOTE — Addendum Note (Signed)
Addended by: Federico FlakeNES, Roxann Vierra A on: 12/15/2016 12:47 PM   Modules accepted: Orders

## 2016-12-15 NOTE — Patient Instructions (Signed)

## 2016-12-15 NOTE — Progress Notes (Signed)
Laura Andersen is a 26 y.o. year old 352P0 African American female here for Nexplanon removal and reinsertion.  She was given informed consent for removal and reinsertion of her Nexplanon. Her Nexplanon was placed 2015, No LMP recorded. Patient has had an implant., and her pregnancy test today was neg.   Risks/benefits/side effects of Nexplanon have been discussed and her questions have been answered.  Specifically, a failure rate of 05/998 has been reported, with an increased failure rate if pt takes St. John's Wort and/or antiseizure medicaitons.  Laura Andersen is aware of the common side effect of irregular bleeding, which the incidence of decreases over time.  BP 120/80   Pulse 84   Wt 203 lb (92.1 kg)   BMI 32.77 kg/m  No LMP recorded. Patient has had an implant. Results for orders placed or performed in visit on 12/15/16 (from the past 24 hour(s))  POCT urine pregnancy   Collection Time: 12/15/16 12:00 PM  Result Value Ref Range   Preg Test, Ur Negative Negative     Appropriate time out taken. Nexplanon site identified.  Area prepped in usual sterile fashon. Two cc's of 2% lidocaine was used to anesthetize the area. A small stab incision was made right beside the implant on the distal portion.  The Nexplanon rod was grasped using hemostats and removed intact without difficulty- NEXPLANON WAS BROKEN IN THE CENTER- NOT COMPLETELY ALL THE WAY THROUGH.  The area was cleansed again with betadine and the Nexplanon was inserted per manufacturer's recommendations without difficulty.  Steri-strips and a pressure bandage was applied.  There was less than 3 cc blood loss. There were no complications.  The patient tolerated the procedure well.  She was instructed to keep the area clean and dry, remove pressure bandage in 24 hours, and keep insertion site covered with the steri-strips for 3-5 days.  She was given a card indicating date Nexplanon was inserted and date it needs to be removed.    Follow-up PRN problems.  Marge DuncansBooker, Jamesha Ellsworth Randall CNM, [facility] 12/15/2016 12:04 PM

## 2017-04-20 ENCOUNTER — Encounter: Payer: Self-pay | Admitting: Advanced Practice Midwife

## 2017-05-02 ENCOUNTER — Encounter: Payer: Self-pay | Admitting: Women's Health

## 2017-05-02 ENCOUNTER — Ambulatory Visit (INDEPENDENT_AMBULATORY_CARE_PROVIDER_SITE_OTHER): Payer: Medicaid Other | Admitting: Women's Health

## 2017-05-02 ENCOUNTER — Other Ambulatory Visit: Payer: Self-pay | Admitting: Obstetrics & Gynecology

## 2017-05-02 VITALS — BP 138/88 | HR 104 | Ht 66.0 in | Wt 199.0 lb

## 2017-05-02 DIAGNOSIS — Z3046 Encounter for surveillance of implantable subdermal contraceptive: Secondary | ICD-10-CM

## 2017-05-02 DIAGNOSIS — N921 Excessive and frequent menstruation with irregular cycle: Secondary | ICD-10-CM | POA: Diagnosis not present

## 2017-05-02 DIAGNOSIS — Z975 Presence of (intrauterine) contraceptive device: Principal | ICD-10-CM

## 2017-05-02 MED ORDER — MEGESTROL ACETATE 40 MG PO TABS: ORAL_TABLET | ORAL | 1 refills | Status: DC

## 2017-05-02 NOTE — Progress Notes (Signed)
   GYN VISIT Patient name: Laura Andersen MRN [id number]  Date of birth: [birth year] Chief Complaint:   Nexplanon Removal  History of Present Illness:   Laura Andersen is a 26 y.o. 852P2002 African American female being seen today initially for removal of Nexplanon. Nexplanon was placed 12/15/16 at which time she had an Implanon removed. States since Nexplanon insertion she has bled daily, not heavy like period, but enough to wear pad/be annoying. Did not do this w/ Implanon. Denies recent change in sex partners. Denies abnormal discharge, itching/odor/irritation.  Is willing to try megace prior to removing Nexplanon-did not know that was an option.   BP slightly elevated today, states she's been running around today. Denies h/o HTN.   No LMP recorded. Patient has had an implant. The current method of family planning is nexplanon. Last pap 11/24/16. Results were:  neg w/ -HRHPV and neg gc/ct Review of Systems:   Pertinent items are noted in HPI Denies fever/chills, dizziness, headaches, visual disturbances, fatigue, shortness of breath, chest pain, abdominal pain, vomiting, abnormal vaginal discharge/itching/odor/irritation, problems with periods, bowel movements, urination, or intercourse unless otherwise stated above.  Pertinent History Reviewed:  Reviewed past medical,surgical, social, obstetrical and family history.  Reviewed problem list, medications and allergies. Physical Assessment:   Vitals:   05/02/17 1436 05/02/17 1454  BP: (!) 148/72 138/88  Pulse: (!) 104   Weight: 199 lb (90.3 kg)   Height: 5\' 6"  (1.676 m)   Body mass index is 32.12 kg/m.       Physical Examination:   General appearance: alert, well appearing, and in no distress  Mental status: alert, oriented to person, place, and time  Skin: warm & dry   Cardiovascular: normal heart rate noted  Respiratory: normal respiratory effort, no distress  Abdomen: soft, non-tender   Pelvic: examination not  indicated  Extremities: no edema   No results found for this or any previous visit (from the past 24 hour(s)).  Assessment & Plan:  1) Breakthrough bleeding on Nexplanon> is almost 5 months s/p insertion, discussed bleeding can last up to 6mths. Wants to try megace. Rx megace algorithm. Give it 3 months of using, if still bleeding after that and wants it out, let us know. Will check gc/ct.   2) BP elevated on arrival, repeat normal. Will monitor at future visits.   Orders Placed This Encounter  Procedures  . GC/Chlamydia Probe Amp    Return for July for physical.  Marge DuncansBooker, Tawonda Legaspi Randall CNM, [facility] 05/02/2017 2:58 PM

## 2017-05-03 LAB — GC/CHLAMYDIA PROBE AMP
CHLAMYDIA, DNA PROBE: NEGATIVE
Neisseria gonorrhoeae by PCR: NEGATIVE

## 2018-12-06 ENCOUNTER — Telehealth: Payer: Self-pay | Admitting: Adult Health

## 2018-12-06 MED ORDER — FLUCONAZOLE 150 MG PO TABS: ORAL_TABLET | ORAL | 1 refills | Status: DC

## 2018-12-06 NOTE — Telephone Encounter (Signed)
Patient called, scheduled a p/p, she is also having discharge an itching x 2 days.  Please advise.  [employer]  [phone]

## 2018-12-06 NOTE — Telephone Encounter (Signed)
Pt complains of discharge and itching no odor, will rx diflucan.

## 2018-12-20 ENCOUNTER — Telehealth: Payer: Self-pay | Admitting: Family

## 2018-12-20 DIAGNOSIS — R300 Dysuria: Secondary | ICD-10-CM

## 2018-12-20 MED ORDER — NITROFURANTOIN MONOHYD MACRO 100 MG PO CAPS: 100.0000 mg | ORAL_CAPSULE | Freq: Two times a day (BID) | ORAL | 0 refills | Status: DC

## 2018-12-20 NOTE — Progress Notes (Signed)

## 2019-01-23 ENCOUNTER — Other Ambulatory Visit: Payer: Self-pay | Admitting: Adult Health

## 2019-02-06 ENCOUNTER — Other Ambulatory Visit (HOSPITAL_COMMUNITY)
Admission: RE | Admit: 2019-02-06 | Discharge: 2019-02-06 | Disposition: A | Discharge status: Home / Self Care | Payer: Medicaid Other | Source: Ambulatory Visit | Attending: Adult Health | Admitting: Adult Health

## 2019-02-06 ENCOUNTER — Other Ambulatory Visit: Payer: Self-pay

## 2019-02-06 ENCOUNTER — Ambulatory Visit: Payer: Medicaid Other | Admitting: Advanced Practice Midwife

## 2019-02-06 ENCOUNTER — Encounter: Payer: Self-pay | Admitting: Advanced Practice Midwife

## 2019-02-06 VITALS — BP 132/73 | HR 88 | Ht 67.0 in | Wt 204.0 lb

## 2019-02-06 DIAGNOSIS — Z0000 Encounter for general adult medical examination without abnormal findings: Secondary | ICD-10-CM

## 2019-02-06 DIAGNOSIS — Z113 Encounter for screening for infections with a predominantly sexual mode of transmission: Secondary | ICD-10-CM | POA: Diagnosis not present

## 2019-02-06 DIAGNOSIS — Z01419 Encounter for gynecological examination (general) (routine) without abnormal findings: Secondary | ICD-10-CM | POA: Diagnosis not present

## 2019-02-06 DIAGNOSIS — L02224 Furuncle of groin: Secondary | ICD-10-CM | POA: Insufficient documentation

## 2019-02-06 DIAGNOSIS — L0292 Furuncle, unspecified: Secondary | ICD-10-CM

## 2019-02-06 DIAGNOSIS — B009 Herpesviral infection, unspecified: Secondary | ICD-10-CM

## 2019-02-06 HISTORY — DX: Furuncle of groin: L02.224

## 2019-02-06 MED ORDER — VALACYCLOVIR HCL 500 MG PO TABS: 500.0000 mg | ORAL_TABLET | Freq: Two times a day (BID) | ORAL | 2 refills | Status: DC

## 2019-02-06 MED ORDER — CEPHALEXIN 500 MG PO CAPS: 500.0000 mg | ORAL_CAPSULE | Freq: Two times a day (BID) | ORAL | 0 refills | Status: AC

## 2019-02-06 NOTE — Progress Notes (Signed)
WELL-WOMAN EXAMINATION Patient name: Laura Andersen MRN [id number]  Date of birth: [birth year] Chief Complaint:   Gynecologic Exam  History of Present Illness:   Laura Andersen is a 28 y.o. G22P2002 African American female being seen today for a routine well-woman exam.   Current complaints: concerned that partner may be unfaithful and desires STD testing; also with a 'bump' along her right groin which first appeared on 02/03/19 and seems to be improving. She has HSV but does not feel like this is an HSV outbreak. She has HSV outbreaks between 2-4x/yr and they are very mild.     does desire labs for STD screening No LMP recorded. Patient has had an implant. The current method of family planning is Nexplanon.  Last pap 11/24/16. Results were: normal Last mammogram: N/A. Family h/o breast cancer: No Last colonoscopy: N/A.  Review of Systems:   Pertinent items are noted in HPI Denies any headaches, blurred vision, fatigue, shortness of breath, chest pain, abdominal pain, abnormal vaginal discharge/itching/odor/irritation, problems with periods, bowel movements, urination, or intercourse unless otherwise stated above. Pertinent History Reviewed:  Reviewed past medical,surgical, social and family history.  Reviewed problem list, medications and allergies. Physical Assessment:   Vitals:   02/06/19 1349  BP: 132/73  Pulse: 88  Weight: 204 lb (92.5 kg)  Height: 5\' 7"  (1.702 m)  Body mass index is 31.95 kg/m.        Physical Examination:   General appearance - well appearing, and in no distress  Mental status - alert, oriented to person, place, and time  Psych:  She has a normal mood and affect  Skin - warm and dry, normal color, no suspicious lesions noted  Chest - effort normal, all lung fields clear to auscultation bilaterally  Heart - normal rate and regular rhythm  Neck:  midline trachea, no thyromegaly or nodules  Breasts - breasts appear normal, no suspicious masses,  no skin or nipple changes or  axillary nodes  Abdomen - soft, nontender, nondistended, no masses or organomegaly  Pelvic - VULVA: normal appearing vulva with no masses; approx 0.5cm size furuncle on R groin, soft, red, not inflammed VAGINA: normal appearing vagina with normal color and discharge, no lesions  CERVIX: normal appearing cervix without discharge or lesions, no CMT  Thin prep pap is done with HR HPV cotesting  UTERUS: uterus is felt to be normal size, shape, consistency and nontender   ADNEXA: No adnexal masses or tenderness noted.    Extremities:  No swelling or varicosities noted  No results found for this or any previous visit (from the past 24 hour(s)).  Assessment & Plan:  1) Well-Woman Exam  2) HSV- stable without prophylactic Valtrex  - rx Valtrex to use prn outbreak or to take daily>her preference  3) Concerned re partner unfaithfulness  - getting STD testing today  4) Furuncle in R groin  - rx Keflex to start if it doesn't improve in 2-3 days  Labs/procedures today: Pap/GC/chlam, RPR, HIV  Mammogram age 21 or sooner if problems Colonoscopy age 60 or sooner if problems  Orders Placed This Encounter  Procedures  . HIV Antibody (routine testing w rflx)  . RPR    Meds:  Meds ordered this encounter  Medications  . valACYclovir (VALTREX) 500 MG tablet    Sig: Take 1 tablet (500 mg total) by mouth 2 (two) times daily. When you have an outbreak, or take 500mg  once daily to prevent an outbreak.    Dispense:  30 tablet    Refill:  2  . cephALEXin (KEFLEX) 500 MG capsule    Sig: Take 1 capsule (500 mg total) by mouth 2 (two) times daily for 5 days.    Dispense:  21 capsule    Refill:  0    Follow-up: Return in about 1 year (around 02/06/2020) for Physical.  Arabella MerlesKimberly D Kirk Sampley CNM 02/06/2019 4:03 PM

## 2019-02-07 LAB — HIV ANTIBODY (ROUTINE TESTING W REFLEX): HIV Screen 4th Generation wRfx: NONREACTIVE

## 2019-02-07 LAB — RPR: RPR Ser Ql: NONREACTIVE

## 2019-02-12 LAB — CYTOLOGY - PAP
Adequacy: ABSENT
Chlamydia: NEGATIVE
Diagnosis: NEGATIVE
High risk HPV: NEGATIVE
Molecular Disclaimer: 56
Molecular Disclaimer: DETECTED
Molecular Disclaimer: NEGATIVE
Molecular Disclaimer: NORMAL
Neisseria Gonorrhea: NEGATIVE

## 2019-03-03 ENCOUNTER — Emergency Department (HOSPITAL_COMMUNITY)
Admission: EM | Admit: 2019-03-03 | Discharge: 2019-03-04 | Disposition: A | Discharge status: Home / Self Care | Payer: Medicaid Other | Attending: Emergency Medicine | Admitting: Emergency Medicine

## 2019-03-03 ENCOUNTER — Other Ambulatory Visit: Payer: Self-pay

## 2019-03-03 ENCOUNTER — Encounter (HOSPITAL_COMMUNITY): Payer: Self-pay | Admitting: Emergency Medicine

## 2019-03-03 DIAGNOSIS — Z5321 Procedure and treatment not carried out due to patient leaving prior to being seen by health care provider: Secondary | ICD-10-CM | POA: Insufficient documentation

## 2019-03-03 DIAGNOSIS — R109 Unspecified abdominal pain: Secondary | ICD-10-CM | POA: Insufficient documentation

## 2019-03-03 HISTORY — DX: Disorder of kidney and ureter, unspecified: N28.9

## 2019-03-03 LAB — URINALYSIS, ROUTINE W REFLEX MICROSCOPIC
Bilirubin Urine: NEGATIVE
Glucose, UA: NEGATIVE mg/dL
Hgb urine dipstick: NEGATIVE
Ketones, ur: NEGATIVE mg/dL
Leukocytes,Ua: NEGATIVE
Nitrite: NEGATIVE
Protein, ur: NEGATIVE mg/dL
Specific Gravity, Urine: 1.026 (ref 1.005–1.030)
pH: 5 (ref 5.0–8.0)

## 2019-03-03 LAB — COMPREHENSIVE METABOLIC PANEL
ALT: 24 U/L (ref 0–44)
AST: 22 U/L (ref 15–41)
Albumin: 4.3 g/dL (ref 3.5–5.0)
Alkaline Phosphatase: 91 U/L (ref 38–126)
Anion gap: 7 (ref 5–15)
BUN: 11 mg/dL (ref 6–20)
CO2: 24 mmol/L (ref 22–32)
Calcium: 9 mg/dL (ref 8.9–10.3)
Chloride: 106 mmol/L (ref 98–111)
Creatinine, Ser: 0.79 mg/dL (ref 0.44–1.00)
GFR calc Af Amer: >60 mL/min (ref >60)
GFR calc non Af Amer: >60 mL/min (ref >60)
Glucose, Bld: 104 mg/dL — ABNORMAL HIGH (ref 70–99)
Potassium: 3.7 mmol/L (ref 3.5–5.1)
Sodium: 137 mmol/L (ref 135–145)
Total Bilirubin: 0.4 mg/dL (ref 0.3–1.2)
Total Protein: 7.5 g/dL (ref 6.5–8.1)

## 2019-03-03 LAB — CBC
HCT: 38.3 % (ref 36.0–46.0)
Hemoglobin: 12.7 g/dL (ref 12.0–15.0)
MCH: 30.3 pg (ref 26.0–34.0)
MCHC: 33.2 g/dL (ref 30.0–36.0)
MCV: 91.4 fL (ref 80.0–100.0)
Platelets: 423 10*3/uL — ABNORMAL HIGH (ref 150–400)
RBC: 4.19 MIL/uL (ref 3.87–5.11)
RDW: 11.7 % (ref 11.5–15.5)
WBC: 13.8 10*3/uL — ABNORMAL HIGH (ref 4.0–10.5)
nRBC: 0 % (ref 0.0–0.2)

## 2019-03-03 NOTE — ED Triage Notes (Signed)
Pt reports left flank pain intermittent x several days becoming more constant today. Denies urinary s/s

## 2019-03-04 NOTE — ED Notes (Signed)
Pt called X 2 no answer. 

## 2019-03-05 LAB — URINE CULTURE: Culture: NO GROWTH

## 2019-03-18 ENCOUNTER — Telehealth: Payer: Self-pay | Admitting: *Deleted

## 2019-03-18 NOTE — Telephone Encounter (Signed)
Pt left message stating that Anderson Malta told her to call if she started having symptoms of BV. Pt reports discharge with fishy odor. Wants rx sent to her pharmacy.

## 2019-03-19 MED ORDER — METRONIDAZOLE 500 MG PO TABS: 500.0000 mg | ORAL_TABLET | Freq: Two times a day (BID) | ORAL | 0 refills | Status: DC

## 2019-03-19 NOTE — Addendum Note (Signed)
Addended by: Derrek Monaco A on: 03/19/2019 08:23 AM   Modules accepted: Orders

## 2019-03-19 NOTE — Telephone Encounter (Signed)
Will rx flagyl  

## 2019-03-20 ENCOUNTER — Other Ambulatory Visit (INDEPENDENT_AMBULATORY_CARE_PROVIDER_SITE_OTHER): Payer: Medicaid Other | Admitting: *Deleted

## 2019-03-20 ENCOUNTER — Other Ambulatory Visit: Payer: Self-pay

## 2019-03-20 ENCOUNTER — Other Ambulatory Visit (HOSPITAL_COMMUNITY)
Admission: RE | Admit: 2019-03-20 | Discharge: 2019-03-20 | Disposition: A | Discharge status: Home / Self Care | Payer: Medicaid Other | Source: Ambulatory Visit | Attending: Obstetrics & Gynecology | Admitting: Obstetrics & Gynecology

## 2019-03-20 DIAGNOSIS — N898 Other specified noninflammatory disorders of vagina: Secondary | ICD-10-CM | POA: Diagnosis not present

## 2019-03-20 NOTE — Progress Notes (Signed)
   NURSE VISIT- VAGINITIS/STD/POC  SUBJECTIVE:  Laura Andersen is a 28 y.o. [id number] GYN patientfemale here for a vaginal swab for STD screen.  She reports the following symptoms: discharge described as malodorous for 3-4 days. Denies abnormal vaginal bleeding, significant pelvic pain, fever, or UTI symptoms.  OBJECTIVE:  There were no vitals taken for this visit.  Appears well, in no apparent distress  ASSESSMENT: Vaginal swab for STD screen  PLAN: Self-collected vaginal probe for Gonorrhea, Chlamydia, Trichomonas, Bacterial Vaginosis, Yeast sent to lab Treatment: to be determined once results are received Follow-up as needed if symptoms persist/worsen, or new symptoms develop  Rash, Celene Squibb  03/20/2019 2:53 PM

## 2019-03-20 NOTE — Progress Notes (Signed)
Chart reviewed for nurse visit. Agree with plan of care.  Estill Dooms, NP 03/20/2019 4:24 PM

## 2019-03-25 ENCOUNTER — Encounter: Payer: Self-pay | Admitting: *Deleted

## 2019-03-25 ENCOUNTER — Telehealth: Payer: Self-pay | Admitting: *Deleted

## 2019-03-25 LAB — CERVICOVAGINAL ANCILLARY ONLY
Bacterial Vaginitis (gardnerella): POSITIVE — AB
Candida Glabrata: NEGATIVE
Candida Vaginitis: NEGATIVE
Chlamydia: NEGATIVE
Comment: NEGATIVE
Comment: NEGATIVE
Comment: NEGATIVE
Comment: NEGATIVE
Comment: NEGATIVE
Comment: NORMAL
Neisseria Gonorrhea: NEGATIVE
Trichomonas: NEGATIVE

## 2019-03-25 NOTE — Telephone Encounter (Signed)
Pt left message that she got a notification about a medicine sent in via mychart but doesn't see any results.

## 2019-10-01 ENCOUNTER — Other Ambulatory Visit: Payer: Self-pay

## 2020-01-08 ENCOUNTER — Other Ambulatory Visit: Payer: Medicaid Other

## 2020-02-05 ENCOUNTER — Other Ambulatory Visit: Payer: Medicaid Other | Admitting: Women's Health

## 2020-02-19 ENCOUNTER — Ambulatory Visit (INDEPENDENT_AMBULATORY_CARE_PROVIDER_SITE_OTHER): Payer: Medicaid Other | Admitting: Women's Health

## 2020-02-19 ENCOUNTER — Encounter: Payer: Self-pay | Admitting: Women's Health

## 2020-02-19 ENCOUNTER — Other Ambulatory Visit (HOSPITAL_COMMUNITY)
Admission: RE | Admit: 2020-02-19 | Discharge: 2020-02-19 | Disposition: A | Discharge status: Home / Self Care | Payer: Medicaid Other | Source: Ambulatory Visit | Attending: Obstetrics & Gynecology | Admitting: Obstetrics & Gynecology

## 2020-02-19 ENCOUNTER — Other Ambulatory Visit: Payer: Self-pay

## 2020-02-19 VITALS — BP 133/82 | HR 95 | Ht 66.0 in | Wt 202.6 lb

## 2020-02-19 DIAGNOSIS — Z01419 Encounter for gynecological examination (general) (routine) without abnormal findings: Secondary | ICD-10-CM

## 2020-02-19 DIAGNOSIS — Z124 Encounter for screening for malignant neoplasm of cervix: Secondary | ICD-10-CM

## 2020-02-19 DIAGNOSIS — Z3046 Encounter for surveillance of implantable subdermal contraceptive: Secondary | ICD-10-CM | POA: Diagnosis not present

## 2020-02-19 DIAGNOSIS — Z01818 Encounter for other preprocedural examination: Secondary | ICD-10-CM | POA: Diagnosis not present

## 2020-02-19 NOTE — Progress Notes (Signed)
WELL-WOMAN EXAMINATION Patient name: Laura Andersen MRN [id number]  Date of birth: 02-10-1991 Chief Complaint:   Gynecologic Exam (Nexplanon Removal)  History of Present Illness:   Laura Andersen is a 29 y.o. G35P2002 African American female being seen today for a routine well-woman exam.  Current complaints: she is having a [place] butt lift and liposuction in [place] on 10/27 and needs labs/cxr/ekg and nexplanon removed (d/t r/f blood clots). Has papers w/ her w/ all needed labs.   Signed consent on chart for Nexplanon removal. Nexplanon was placed 12/15/16, so it is actually past-due for removal.   Depression screen [facility] 2/9 02/19/2020 02/06/2019 11/24/2016  Decreased Interest 0 0 0  Down, Depressed, Hopeless 0 0 0  PHQ - 2 Score 0 0 0  Altered sleeping 1 - -  Tired, decreased energy 1 - -  Change in appetite 0 - -  Feeling bad or failure about yourself  0 - -  Trouble concentrating 0 - -  Moving slowly or fidgety/restless 0 - -  Suicidal thoughts 0 - -  PHQ-9 Score 2 - -  Difficult doing work/chores Not difficult at all - -     PCP: none      does desire labs No LMP recorded. Patient has had an implant. The current method of family planning is Nexplanon, will use condoms/withdrawal, then wants to have nexplanon replaced in Nov after surgery Last pap 02/06/19. Results were: normal. H/O abnormal pap: no Last mammogram: never. Results were: N/A. Family h/o breast cancer: no Last colonoscopy: never. Results were: N/A. Family h/o colorectal cancer: no Review of Systems:   Pertinent items are noted in HPI Denies any headaches, blurred vision, fatigue, shortness of breath, chest pain, abdominal pain, abnormal vaginal discharge/itching/odor/irritation, problems with periods, bowel movements, urination, or intercourse unless otherwise stated above. Pertinent History Reviewed:  Reviewed past medical,surgical, social and family history.  Reviewed problem list, medications and  allergies. Physical Assessment:   Vitals:   02/19/20 1337  BP: 133/82  Pulse: 95  Weight: 202 lb 9.6 oz (91.9 kg)  Height: 5\' 6"  (1.676 m)  Body mass index is 32.7 kg/m.   NEXPLANON REMOVAL Time out was performed.  Nexplanon site identified.  Area prepped in usual sterile fashon. One cc of 2% lidocaine was used to anesthetize the area at the distal end of the implant. A small stab incision was made right beside the implant on the distal portion.  The Nexplanon rod was grasped using hemostats and removed without difficulty.  There was less than 3 cc blood loss. There were no complications.  Steri-strips were applied over the small incision and a pressure bandage was applied.  The patient tolerated the procedure well.       Physical Examination:   General appearance - well appearing, and in no distress  Mental status - alert, oriented to person, place, and time  Psych:  She has a normal mood and affect  Skin - warm and dry, normal color, no suspicious lesions noted  Chest - effort normal, all lung fields clear to auscultation bilaterally  Heart - normal rate and regular rhythm  Neck:  midline trachea, no thyromegaly or nodules  Breasts - breasts appear normal, no suspicious masses, no skin or nipple changes or  axillary nodes  Abdomen - soft, nontender, nondistended, no masses or organomegaly  Pelvic - VULVA: normal appearing vulva with no masses, tenderness or lesions  VAGINA: normal appearing vagina with normal color and discharge, no lesions  CERVIX: normal appearing cervix without discharge or lesions, no CMT  Thin prep pap is done w/ HR HPV cotesting  UTERUS: uterus is felt to be normal size, shape, consistency and nontender   ADNEXA: No adnexal masses or tenderness noted.  Extremities:  No swelling or varicosities noted  Chaperone: [place]    No results found for this or any previous visit (from the past 24 hour(s)).  Assessment & Plan:  1) Well-Woman Exam  2)  Nexplanon removal She was instructed to keep the area clean and dry, remove pressure bandage in 24 hours, and keep insertion site covered with the steri-strip for 3-5 days.   Follow-up PRN problems. Plans condoms/withdrawal, then Nexplanon insertion in Nov after surgery. Has to be 14d from last sex or on period.   3) Pre-op labs/cxr/ekg> for upcoming Brazilian butt lift/lipo in [place] on 10/27, per papers she has with her from Dr. Tressa Busman, we are to fax results to [phone]. Note routed to Tish to schedule cxr/ekg and let pt know date/time  Labs/procedures today: nexplanon removal, pap, labs as below  Mammogram @31yo  or sooner if problems Colonoscopy @31yo  or sooner if problems  Orders Placed This Encounter  Procedures  . DG Chest 2 View  . CBC w/Diff  . Comprehensive metabolic panel  . Protime-INR  . PTT  . hCG, serum, qualitative  . Urinalysis, Routine w reflex microscopic  . HIV antibody (with reflex)  . EKG 12-Lead    Meds: No orders of the defined types were placed in this encounter.   Follow-up: Return for in Nov for Nexp insert.  CNM, [facility] 02/19/2020 5:02 PM

## 2020-02-19 NOTE — Patient Instructions (Signed)
Condoms with pull-out  No sex for 14 days prior to your appointment to get your nexplanon back in

## 2020-02-20 LAB — COMPREHENSIVE METABOLIC PANEL
ALT: 26 IU/L (ref 0–32)
AST: 21 IU/L (ref 0–40)
Albumin/Globulin Ratio: 1.7 (ref 1.2–2.2)
Albumin: 4.5 g/dL (ref 3.8–4.8)
Alkaline Phosphatase: 108 IU/L (ref 44–121)
BUN/Creatinine Ratio: 13 (ref 9–23)
BUN: 8 mg/dL (ref 6–20)
Bilirubin Total: <0.2 mg/dL (ref 0.0–1.2)
CO2: 23 mmol/L (ref 20–29)
Calcium: 9.6 mg/dL (ref 8.7–10.2)
Chloride: 102 mmol/L (ref 96–106)
Creatinine, Ser: 0.6 mg/dL (ref 0.57–1.00)
GFR calc Af Amer: 140 mL/min/{1.73_m2} (ref >59)
GFR calc non Af Amer: 122 mL/min/{1.73_m2} (ref >59)
Globulin, Total: 2.6 g/dL (ref 1.5–4.5)
Glucose: 68 mg/dL (ref 65–99)
Potassium: 4.2 mmol/L (ref 3.5–5.2)
Sodium: 139 mmol/L (ref 134–144)
Total Protein: 7.1 g/dL (ref 6.0–8.5)

## 2020-02-20 LAB — CBC WITH DIFFERENTIAL/PLATELET
Basophils Absolute: 0.1 10*3/uL (ref 0.0–0.2)
Basos: 1 %
EOS (ABSOLUTE): 0.1 10*3/uL (ref 0.0–0.4)
Eos: 1 %
Hematocrit: 38.7 % (ref 34.0–46.6)
Hemoglobin: 13.6 g/dL (ref 11.1–15.9)
Immature Grans (Abs): 0 10*3/uL (ref 0.0–0.1)
Immature Granulocytes: 0 %
Lymphocytes Absolute: 3.2 10*3/uL — ABNORMAL HIGH (ref 0.7–3.1)
Lymphs: 28 %
MCH: 32 pg (ref 26.6–33.0)
MCHC: 35.1 g/dL (ref 31.5–35.7)
MCV: 91 fL (ref 79–97)
Monocytes Absolute: 0.8 10*3/uL (ref 0.1–0.9)
Monocytes: 7 %
Neutrophils Absolute: 7.6 10*3/uL — ABNORMAL HIGH (ref 1.4–7.0)
Neutrophils: 63 %
Platelets: 453 10*3/uL — ABNORMAL HIGH (ref 150–450)
RBC: 4.25 x10E6/uL (ref 3.77–5.28)
RDW: 11.9 % (ref 11.7–15.4)
WBC: 11.8 10*3/uL — ABNORMAL HIGH (ref 3.4–10.8)

## 2020-02-20 LAB — URINALYSIS, ROUTINE W REFLEX MICROSCOPIC
Bilirubin, UA: NEGATIVE
Glucose, UA: NEGATIVE
Ketones, UA: NEGATIVE
Leukocytes,UA: NEGATIVE
Nitrite, UA: NEGATIVE
Protein,UA: NEGATIVE
RBC, UA: NEGATIVE
Specific Gravity, UA: 1.023 (ref 1.005–1.030)
Urobilinogen, Ur: 0.2 mg/dL (ref 0.2–1.0)
pH, UA: 8 — ABNORMAL HIGH (ref 5.0–7.5)

## 2020-02-20 LAB — PROTIME-INR
INR: 0.9 (ref 0.9–1.2)
Prothrombin Time: 9.9 s (ref 9.1–12.0)

## 2020-02-20 LAB — APTT: aPTT: 31 s (ref 24–33)

## 2020-02-20 LAB — HCG, SERUM, QUALITATIVE: hCG,Beta Subunit,Qual,Serum: NEGATIVE m[IU]/mL (ref <6)

## 2020-02-20 LAB — HIV ANTIBODY (ROUTINE TESTING W REFLEX): HIV Screen 4th Generation wRfx: NONREACTIVE

## 2020-02-24 LAB — CYTOLOGY - PAP
Chlamydia: NEGATIVE
Comment: NEGATIVE
Comment: NEGATIVE
Comment: NORMAL
Diagnosis: NEGATIVE
Diagnosis: REACTIVE
High risk HPV: NEGATIVE
Neisseria Gonorrhea: NEGATIVE

## 2020-02-26 ENCOUNTER — Ambulatory Visit (HOSPITAL_COMMUNITY)
Admission: RE | Admit: 2020-02-26 | Payer: Medicaid Other | Source: Ambulatory Visit | Attending: *Deleted | Admitting: *Deleted

## 2020-02-26 ENCOUNTER — Other Ambulatory Visit: Payer: Self-pay

## 2020-02-26 ENCOUNTER — Telehealth: Payer: Self-pay | Admitting: *Deleted

## 2020-02-26 ENCOUNTER — Encounter (HOSPITAL_COMMUNITY): Payer: Self-pay

## 2020-02-26 ENCOUNTER — Ambulatory Visit (HOSPITAL_COMMUNITY)
Admission: RE | Admit: 2020-02-26 | Discharge: 2020-02-26 | Disposition: A | Discharge status: Home / Self Care | Payer: Medicaid Other | Source: Ambulatory Visit | Attending: Women's Health | Admitting: Women's Health

## 2020-02-26 DIAGNOSIS — Z01818 Encounter for other preprocedural examination: Secondary | ICD-10-CM | POA: Insufficient documentation

## 2020-02-26 NOTE — Telephone Encounter (Signed)
-----   Message from Cheral Marker, [place] sent at 02/19/2020  2:12 PM EDT ----- Regarding: ekg, cxr Needs chest xray and ekg at [facility] for pre-op (have BBL/lipo in [place]). Orders placed, please schedule and let pt know. Thanks

## 2020-02-26 NOTE — Telephone Encounter (Signed)
Patient informed that she can just walk in over at [facility] to have chest x-ray and EKG.  Just needs to go to Short Stay and check in and let them know what she is there for as orders are already in.

## 2020-03-04 ENCOUNTER — Other Ambulatory Visit: Payer: Medicaid Other

## 2020-03-04 ENCOUNTER — Other Ambulatory Visit: Payer: Self-pay

## 2020-03-04 DIAGNOSIS — Z20822 Contact with and (suspected) exposure to covid-19: Secondary | ICD-10-CM | POA: Diagnosis not present

## 2020-03-05 LAB — SARS-COV-2, NAA 2 DAY TAT

## 2020-03-05 LAB — NOVEL CORONAVIRUS, NAA: SARS-CoV-2, NAA: NOT DETECTED

## 2020-04-06 ENCOUNTER — Other Ambulatory Visit: Payer: Self-pay

## 2020-04-06 ENCOUNTER — Ambulatory Visit (INDEPENDENT_AMBULATORY_CARE_PROVIDER_SITE_OTHER): Payer: Medicaid Other | Admitting: Adult Health

## 2020-04-06 ENCOUNTER — Encounter: Payer: Self-pay | Admitting: Adult Health

## 2020-04-06 VITALS — BP 145/92 | HR 97 | Ht 66.0 in | Wt 206.0 lb

## 2020-04-06 DIAGNOSIS — Z30017 Encounter for initial prescription of implantable subdermal contraceptive: Secondary | ICD-10-CM

## 2020-04-06 DIAGNOSIS — Z3202 Encounter for pregnancy test, result negative: Secondary | ICD-10-CM | POA: Diagnosis not present

## 2020-04-06 DIAGNOSIS — Z3049 Encounter for surveillance of other contraceptives: Secondary | ICD-10-CM

## 2020-04-06 LAB — POCT URINE PREGNANCY: Preg Test, Ur: NEGATIVE

## 2020-04-06 MED ORDER — ETONOGESTREL 68 MG ~~LOC~~ IMPL
68.0000 mg | DRUG_IMPLANT | Freq: Once | SUBCUTANEOUS | Status: AC
  Administered 2020-04-06: 68 mg via SUBCUTANEOUS

## 2020-04-06 NOTE — Patient Instructions (Signed)
Use condoms x 2 weeks, keep clean and dry x 24 hours, no heavy lifting, keep steri strips on x 72 hours, Keep pressure dressing on x 24 hours. Follow up prn problems.  

## 2020-04-06 NOTE — Addendum Note (Signed)
Addended by: Leilani Able, Amorina Doerr A on: 04/06/2020 02:19 PM   Modules accepted: Orders

## 2020-04-06 NOTE — Progress Notes (Signed)
  Subjective:     Patient ID: Laura Andersen, female   DOB: 11/26/1990, 29 y.o.   MRN: [id number]  HPI Laura Andersen is a 29 year old black female, single G2P2 in for nexplanon insertion.  She had normal pap with negative HRHPV 02/19/20.   Review of Systems For nexplanon insertion No period since nexplanon removed on September, she had lipo suction and butt lift in October  Last sexual encounter, > 2 weeks  Reviewed past medical,surgical, social and family history. Reviewed medications and allergies.     Objective:   Physical Exam BP (!) 145/92 (BP Location: Left Arm, Patient Position: Sitting, Cuff Size: Large)   Pulse 97   Ht 5\' 6"  (1.676 m)   Wt 206 lb (93.4 kg)   BMI 33.25 kg/m UPT is negative. Consent signed, time out called. Left arm cleansed with betadine, and injected with 1.5 cc 2% lidocaine and waited til numb. Nexplanon easily inserted and steri strips applied.Rod easily palpated by provider, pt declined.  Pressure dressing applied.    Assessment:     1. Nexplanon insertion Lot # Exp [id number]     Plan:     Use condoms x 2 weeks, keep clean and dry x 24 hours, no heavy lifting, keep steri strips on x 72 hours, Keep pressure dressing on x 24 hours. Follow up prn problems.   Remove in 3 years or sooner if desired.

## 2020-04-08 ENCOUNTER — Encounter: Payer: Self-pay | Admitting: Internal Medicine

## 2020-04-08 ENCOUNTER — Other Ambulatory Visit: Payer: Self-pay

## 2020-04-08 ENCOUNTER — Ambulatory Visit: Payer: Medicaid Other | Admitting: Internal Medicine

## 2020-04-08 VITALS — BP 148/87 | HR 69 | Temp 98.4°F | Resp 16 | Ht 66.0 in | Wt 206.0 lb

## 2020-04-08 DIAGNOSIS — Z9889 Other specified postprocedural states: Secondary | ICD-10-CM

## 2020-04-08 DIAGNOSIS — Z23 Encounter for immunization: Secondary | ICD-10-CM | POA: Diagnosis not present

## 2020-04-08 DIAGNOSIS — Z0000 Encounter for general adult medical examination without abnormal findings: Secondary | ICD-10-CM

## 2020-04-08 DIAGNOSIS — E669 Obesity, unspecified: Secondary | ICD-10-CM | POA: Diagnosis not present

## 2020-04-08 DIAGNOSIS — I10 Essential (primary) hypertension: Secondary | ICD-10-CM

## 2020-04-08 DIAGNOSIS — Z111 Encounter for screening for respiratory tuberculosis: Secondary | ICD-10-CM

## 2020-04-08 DIAGNOSIS — E66811 Obesity, class 1: Secondary | ICD-10-CM

## 2020-04-08 DIAGNOSIS — Z7689 Persons encountering health services in other specified circumstances: Secondary | ICD-10-CM

## 2020-04-08 MED ORDER — AMLODIPINE BESYLATE 5 MG PO TABS: 5.0000 mg | ORAL_TABLET | Freq: Every day | ORAL | 3 refills | Status: DC

## 2020-04-08 NOTE — Progress Notes (Signed)
Tb

## 2020-04-08 NOTE — Assessment & Plan Note (Signed)
S/p BBL recently No active complaints

## 2020-04-08 NOTE — Assessment & Plan Note (Signed)
Care established Previous chart reviewed History and medications reviewed with the patient 

## 2020-04-08 NOTE — Assessment & Plan Note (Signed)
Diet modification and moderate exercise advised. 

## 2020-04-08 NOTE — Patient Instructions (Signed)
Please start taking Amlodipine as prescribed.  Please follow DASH diet and perform moderate exercise/walking at least 150 mins/week in a week.  DASH stands for Dietary Approaches to Stop Hypertension. The DASH diet is a healthy-eating plan designed to help treat or prevent high blood pressure (hypertension).  The DASH diet includes foods that are rich in potassium, calcium and magnesium. These nutrients help control blood pressure. The diet limits foods that are high in sodium, saturated fat and added sugars.  Studies have shown that the DASH diet can lower blood pressure in as little as two weeks. The diet can also lower low-density lipoprotein (LDL or "bad") cholesterol levels in the blood. High blood pressure and high LDL cholesterol levels are two major risk factors for heart disease and stroke.    DASH diet: Recommended servings The DASH diet provides daily and weekly nutritional goals. The number of servings you should have depends on your daily calorie needs.  Here's a look at the recommended servings from each food group for a 2,000-calorie-a-day DASH diet:  Grains: 6 to 8 servings a day. One serving is one slice bread, 1 ounce dry cereal, or 1/2 cup cooked cereal, rice or pasta. Vegetables: 4 to 5 servings a day. One serving is 1 cup raw leafy green vegetable, 1/2 cup cut-up raw or cooked vegetables, or 1/2 cup vegetable juice. Fruits: 4 to 5 servings a day. One serving is one medium fruit, 1/2 cup fresh, frozen or canned fruit, or 1/2 cup fruit juice. Fat-free or low-fat dairy products: 2 to 3 servings a day. One serving is 1 cup milk or yogurt, or 1 1/2 ounces cheese. Lean meats, poultry and fish: six 1-ounce servings or fewer a day. One serving is 1 ounce cooked meat, poultry or fish, or 1 egg. Nuts, seeds and legumes: 4 to 5 servings a week. One serving is 1/3 cup nuts, 2 tablespoons peanut butter, 2 tablespoons seeds, or 1/2 cup cooked legumes (dried beans or peas). Fats and  oils: 2 to 3 servings a day. One serving is 1 teaspoon soft margarine, 1 teaspoon vegetable oil, 1 tablespoon mayonnaise or 2 tablespoons salad dressing. Sweets and added sugars: 5 servings or fewer a week. One serving is 1 tablespoon sugar, jelly or jam, 1/2 cup sorbet, or 1 cup lemonade.

## 2020-04-08 NOTE — Progress Notes (Signed)
New Patient Office Visit  Subjective:  Patient ID: Laura Andersen, female    DOB: [birth year]  Age: 29 y.o. 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  She states that she has been in good health overall. She is about to start a new job and wanted to get Physical exam for it. She has not brought the form with her.  Her BP was 148/87 in the office today. On repeat measure, it was 145/92. She has been told of high BP in the last OB/GYN visit few days ago. She denies headache, dizziness, chest pain, dyspnea or palpitations. Her mother has h/o hypertension. EKG was reviewed from the system. She is not on any medications currently.  She recently had a cosmetic surgery (BBL) in [place]. She denies any active complaints currently.  She had last PAP smear in 01/2020.  She has had both doses of COVID vaccine. She received flu vaccine in the office today.  Past Medical History:  Diagnosis Date   Furuncle of groin 02/06/2019   Herpes    Renal disorder    kidney stones    Past Surgical History:  Procedure Laterality Date   COSMETIC SURGERY N/A    Phreesia 04/07/2020   DILATION AND CURETTAGE OF UTERUS      Family History  Problem Relation Age of Onset   Cancer Maternal Grandmother    Asthma Maternal Grandfather    Diabetes Father    Heart attack Father    Asthma Son     Social History   Socioeconomic History   Marital status: Single    Spouse name: Not on file   Number of children: Not on file   Years of education: Not on file   Highest education level: Not on file  Occupational History   Not on file  Tobacco Use   Smoking status: Former Smoker    Packs/day: 0.25    Types:  Cigarettes    Quit date: 02/10/2020    Years since quitting: 0.1   Smokeless tobacco: Never Used  Vaping Use   Vaping Use: Never used  Substance and Sexual Activity   Alcohol use: Yes    Comment: occ   Drug use: No   Sexual activity: Not Currently    Birth control/protection: Implant  Other Topics Concern   Not on file  Social History Narrative   Not on file   Social Determinants of Health   Financial Resource Strain: Low Risk    Difficulty of Paying Living Expenses: Not very hard  Food Insecurity: Food Insecurity Present   Worried About Running Out of Food in the Last Year: Sometimes true   Ran Out of Food in the Last Year: Never true  Transportation Needs: No Transportation Needs   Lack of Transportation (Medical): No   Lack of Transportation (Non-Medical): No  Physical Activity: Sufficiently Active   Days of Exercise per Week: 4 days   Minutes of Exercise per Session: 60 min  Stress: Stress Concern Present   Feeling of Stress : To some extent  Social Connections: Unknown   Frequency of Communication with Friends and Family: More than three times a week   Frequency of  Social Gatherings with Friends and Family: Once a week   Attends Religious Services: 1 to 4 times per year   Active Member of [employer] or Organizations: No   Attends Engineer, structural: Never   Marital Status: Not on file  Intimate Partner Violence: Not At Risk   Fear of Current or Ex-Partner: No   Emotionally Abused: No   Physically Abused: No   Sexually Abused: No    ROS Review of Systems  Constitutional: Negative for chills and fever.  HENT: Negative for congestion, sinus pressure, sinus pain and sore throat.   Eyes: Negative for pain and discharge.  Respiratory: Negative for cough and shortness of breath.   Cardiovascular: Negative for chest pain and palpitations.  Gastrointestinal: Negative for abdominal pain, constipation, diarrhea, nausea and vomiting.   Endocrine: Negative for polydipsia and polyuria.  Genitourinary: Negative for dysuria and hematuria.  Musculoskeletal: Negative for neck pain and neck stiffness.  Skin: Negative for rash.  Neurological: Negative for dizziness and weakness.  Psychiatric/Behavioral: Negative for agitation and behavioral problems.    Objective:   Today's Vitals: BP (!) 148/87 (BP Location: Right Arm, Patient Position: Sitting, Cuff Size: Normal)    Pulse 69    Temp 98.4 F (36.9 C) (Oral)    Resp 16    Ht 5\' 6"  (1.676 m)    Wt 206 lb (93.4 kg)    SpO2 96%    BMI 33.25 kg/m   Physical Exam Vitals reviewed.  Constitutional:      General: She is not in acute distress.    Appearance: She is not diaphoretic.  HENT:     Head: Normocephalic and atraumatic.     Nose: Nose normal.     Mouth/Throat:     Mouth: Mucous membranes are moist.  Eyes:     General: No scleral icterus.    Extraocular Movements: Extraocular movements intact.     Pupils: Pupils are equal, round, and reactive to light.  Cardiovascular:     Rate and Rhythm: Normal rate and regular rhythm.     Pulses: Normal pulses.     Heart sounds: No murmur heard.   Pulmonary:     Breath sounds: Normal breath sounds. No wheezing or rales.  Abdominal:     Palpations: Abdomen is soft.     Tenderness: There is no abdominal tenderness.  Musculoskeletal:     Cervical back: Neck supple. No tenderness.     Right lower leg: No edema.     Left lower leg: No edema.  Skin:    General: Skin is warm.     Findings: No rash.  Neurological:     General: No focal deficit present.     Mental Status: She is alert and oriented to person, place, and time.     Sensory: No sensory deficit.     Motor: No weakness.  Psychiatric:        Mood and Affect: Mood normal.        Behavior: Behavior normal.     Assessment & Plan:   Problem List Items Addressed This Visit      Cardiovascular and Mediastinum   Essential (primary) hypertension    BP Readings from  Last 1 Encounters:  04/08/20 (!) 148/87   New-onset, started Amlodipine 5 mg QD Counseled for compliance with the medications Advised DASH diet and moderate exercise/walking, at least 150 mins/week Advised to check BP at home and bring the log in the next visit  Last CMP and  EKG reviewed. Sinus rhythm. No signs of active ischemia.       Relevant Medications   amLODipine (NORVASC) 5 MG tablet     Other   Encounter for medical examination to establish care    Care established Previous chart reviewed History and medications reviewed with the patient      S/P cosmetic plastic surgery    S/p BBL recently No active complaints      Obesity (BMI 30.0-34.9)    Diet modification and moderate exercise advised       Other Visit Diagnoses    Encounter to establish care    -  Primary   Primary hypertension       Relevant Medications   amLODipine (NORVASC) 5 MG tablet   Need for immunization against influenza       Relevant Orders   Flu Vaccine QUAD 36+ mos IM (Completed)   Encounter for PPD test       Relevant Orders   PPD      Outpatient Encounter Medications as of 04/08/2020  Medication Sig   amLODipine (NORVASC) 5 MG tablet Take 1 tablet (5 mg total) by mouth daily.   No facility-administered encounter medications on file as of 04/08/2020.    Follow-up: Return in about 6 weeks (around 05/20/2020).   Anabel Halon, MD

## 2020-04-08 NOTE — Assessment & Plan Note (Addendum)
BP Readings from Last 1 Encounters:  04/08/20 (!) 148/87   New-onset, started Amlodipine 5 mg QD Counseled for compliance with the medications Advised DASH diet and moderate exercise/walking, at least 150 mins/week Advised to check BP at home and bring the log in the next visit  Last CMP and EKG reviewed. Sinus rhythm. No signs of active ischemia.

## 2020-04-08 NOTE — Assessment & Plan Note (Signed)
Distant h/o herpes simplex Not on any ppx meds

## 2020-04-10 LAB — TB SKIN TEST
Induration: 0 mm
TB Skin Test: NEGATIVE

## 2020-05-05 ENCOUNTER — Other Ambulatory Visit: Payer: Self-pay

## 2020-05-05 ENCOUNTER — Encounter: Payer: Self-pay | Admitting: Internal Medicine

## 2020-05-05 ENCOUNTER — Ambulatory Visit: Payer: Medicaid Other | Admitting: Internal Medicine

## 2020-05-05 VITALS — BP 137/80 | HR 76 | Temp 99.4°F | Resp 18 | Ht 66.0 in | Wt 197.0 lb

## 2020-05-05 DIAGNOSIS — R55 Syncope and collapse: Secondary | ICD-10-CM

## 2020-05-05 NOTE — Progress Notes (Signed)
New Patient Office Visit  Subjective:  Patient ID: Laura Andersen, female    DOB: Jun 12, 1990  Age: 29 y.o. MRN: [id number]  CC:  Chief Complaint  Patient presents with  . Acute Visit    Pt has been having episode of diarrhea getting really hot her mouth starts watering she gets cool water to put on face next thing she remembers she is in the floor shaking and bleeding does have scratches on her arms and her lip is busted. This happened this morning around 5:30 am    HPI Laura Andersen is a 29 year old female with PMH of HTN who presents for evaluation after an episode of fall at home.  Patient reports having an episode of 1 episode of watery diarrhea this morning, after which she felt like lightheaded and hot sensation. She tried to reach the sink while sitting on the toilet to get some cold water. She fell on the floor, but the next thing she remembered was she was curled up on the floor. She thinks that she might have hit her broken trash can, and reports cut injuries on the lower lip and left forearm. She reports some shaking movement after gaining consciousness. It was an unwitnessed episode. She reports similar episodes many years ago.  Past Medical History:  Diagnosis Date  . Furuncle of groin 02/06/2019  . Herpes   . Renal disorder    kidney stones    Past Surgical History:  Procedure Laterality Date  . COSMETIC SURGERY N/A    Phreesia 04/07/2020  . DILATION AND CURETTAGE OF UTERUS      Family History  Problem Relation Age of Onset  . Cancer Maternal Grandmother   . Asthma Maternal Grandfather   . Diabetes Father   . Heart attack Father   . Asthma Son     Social History   Socioeconomic History  . Marital status: Single    Spouse name: Not on file  . Number of children: Not on file  . Years of education: Not on file  . Highest education level: Not on file  Occupational History  . Not on file  Tobacco Use  . Smoking status: Former Smoker     Packs/day: 0.25    Types: Cigarettes    Quit date: 02/10/2020    Years since quitting: 0.2  . Smokeless tobacco: Never Used  Vaping Use  . Vaping Use: Never used  Substance and Sexual Activity  . Alcohol use: Yes    Comment: occ  . Drug use: No  . Sexual activity: Not Currently    Birth control/protection: Implant  Other Topics Concern  . Not on file  Social History Narrative  . Not on file   Social Determinants of Health   Financial Resource Strain: Low Risk   . Difficulty of Paying Living Expenses: Not very hard  Food Insecurity: Food Insecurity Present  . Worried About Programme researcher, broadcasting/film/video in the Last Year: Sometimes true  . Ran Out of Food in the Last Year: Never true  Transportation Needs: No Transportation Needs  . Lack of Transportation (Medical): No  . Lack of Transportation (Non-Medical): No  Physical Activity: Sufficiently Active  . Days of Exercise per Week: 4 days  . Minutes of Exercise per Session: 60 min  Stress: Stress Concern Present  . Feeling of Stress : To some extent  Social Connections: Unknown  . Frequency of Communication with Friends and Family: More than three times a week  . Frequency  of Social Gatherings with Friends and Family: Once a week  . Attends Religious Services: 1 to 4 times per year  . Active Member of Clubs or Organizations: No  . Attends Banker Meetings: Never  . Marital Status: Not on file  Intimate Partner Violence: Not At Risk  . Fear of Current or Ex-Partner: No  . Emotionally Abused: No  . Physically Abused: No  . Sexually Abused: No    ROS Review of Systems  Constitutional: Negative for chills and fever.  HENT: Negative for congestion, sinus pressure, sinus pain and sore throat.   Eyes: Negative for pain and discharge.  Respiratory: Negative for cough and shortness of breath.   Cardiovascular: Negative for chest pain and palpitations.  Gastrointestinal: Negative for abdominal pain, constipation, diarrhea,  nausea and vomiting.  Endocrine: Negative for polydipsia and polyuria.  Genitourinary: Negative for dysuria and hematuria.  Musculoskeletal: Negative for neck pain and neck stiffness.  Skin: Negative for rash.  Neurological: Positive for syncope and light-headedness. Negative for dizziness, weakness and headaches.  Psychiatric/Behavioral: Negative for agitation and behavioral problems.    Objective:   Today's Vitals: BP 137/80 (BP Location: Right Arm, Patient Position: Sitting, Cuff Size: Normal)   Pulse 76   Temp 99.4 F (37.4 C) (Oral)   Resp 18   Ht 5\' 6"  (1.676 m)   Wt 197 lb (89.4 kg)   SpO2 98%   BMI 31.80 kg/m   Physical Exam Vitals reviewed.  Constitutional:      General: She is not in acute distress.    Appearance: She is not diaphoretic.  HENT:     Head: Normocephalic and atraumatic.     Nose: Nose normal.     Mouth/Throat:     Mouth: Mucous membranes are moist.  Eyes:     General: No scleral icterus.    Extraocular Movements: Extraocular movements intact.     Pupils: Pupils are equal, round, and reactive to light.  Cardiovascular:     Rate and Rhythm: Normal rate and regular rhythm.     Pulses: Normal pulses.     Heart sounds: Normal heart sounds. No murmur heard.   Pulmonary:     Breath sounds: Normal breath sounds. No wheezing or rales.  Abdominal:     Palpations: Abdomen is soft.     Tenderness: There is no abdominal tenderness.  Musculoskeletal:     Cervical back: Neck supple. No tenderness.     Right lower leg: No edema.     Left lower leg: No edema.  Skin:    General: Skin is warm.     Findings: No rash.  Neurological:     General: No focal deficit present.     Mental Status: She is alert and oriented to person, place, and time.     Sensory: No sensory deficit.     Motor: No weakness.  Psychiatric:        Mood and Affect: Mood normal.        Behavior: Behavior normal.      Assessment & Plan:   Problem List Items Addressed This Visit       Other   Syncope - Primary    Likely in the setting of dehydration Had prodromal symptoms of lightheadedness, hot sensation and describes being confused after the episode Reports injury on lip and left forearm, appears to be related to exterior injury due to fall EKG: Sinus rhythm. Nonspecific T wave flattening in lead III. Avoid sudden positional changes  Avoid excessive heat or cold, stressful situation and crowded places for now Stay hydrated by taking at least 2-2.5 liters of fluid in a day          Outpatient Encounter Medications as of 05/05/2020  Medication Sig  . amLODipine (NORVASC) 5 MG tablet Take 1 tablet (5 mg total) by mouth daily.   No facility-administered encounter medications on file as of 05/05/2020.    Follow-up: Return if symptoms worsen or fail to improve.   Anabel Halon, MD

## 2020-05-05 NOTE — Assessment & Plan Note (Addendum)
Likely in the setting of dehydration Had prodromal symptoms of lightheadedness, hot sensation and describes being confused after the episode Reports injury on lip and left forearm, appears to be related to exterior injury due to fall EKG: Sinus rhythm. Nonspecific T wave flattening in lead III. Avoid sudden positional changes Avoid excessive heat or cold, stressful situation and crowded places for now Stay hydrated by taking at least 2-2.5 liters of fluid in a day

## 2020-05-05 NOTE — Patient Instructions (Signed)
Please maintain following measures to avoid episodes of syncope:  - Avoid sudden positional changes - Avoid excessive heat or cold, stressful situation and crowded places for now - Stay hydrated by taking at least 2-2.5 liters of fluid in a day  Please get immediate medical attention if you have shaking movements, chest pain with palpitations or shortness of breath.

## 2020-05-20 ENCOUNTER — Ambulatory Visit: Payer: Medicaid Other | Admitting: Internal Medicine

## 2020-06-06 ENCOUNTER — Other Ambulatory Visit: Payer: Medicaid Other

## 2020-06-06 DIAGNOSIS — Z20822 Contact with and (suspected) exposure to covid-19: Secondary | ICD-10-CM | POA: Diagnosis not present

## 2020-06-09 LAB — NOVEL CORONAVIRUS, NAA: SARS-CoV-2, NAA: DETECTED — AB

## 2020-06-15 ENCOUNTER — Other Ambulatory Visit: Payer: Self-pay

## 2020-08-01 ENCOUNTER — Encounter (HOSPITAL_COMMUNITY): Payer: Self-pay | Admitting: Emergency Medicine

## 2020-08-01 ENCOUNTER — Other Ambulatory Visit: Payer: Self-pay

## 2020-08-01 ENCOUNTER — Emergency Department (HOSPITAL_COMMUNITY)
Admission: EM | Admit: 2020-08-01 | Discharge: 2020-08-01 | Disposition: A | Discharge status: Home / Self Care | Payer: Medicaid Other | Attending: Emergency Medicine | Admitting: Emergency Medicine

## 2020-08-01 DIAGNOSIS — T7840XA Allergy, unspecified, initial encounter: Secondary | ICD-10-CM | POA: Insufficient documentation

## 2020-08-01 DIAGNOSIS — Z79899 Other long term (current) drug therapy: Secondary | ICD-10-CM | POA: Diagnosis not present

## 2020-08-01 DIAGNOSIS — Z87891 Personal history of nicotine dependence: Secondary | ICD-10-CM | POA: Diagnosis not present

## 2020-08-01 DIAGNOSIS — L239 Allergic contact dermatitis, unspecified cause: Secondary | ICD-10-CM | POA: Diagnosis not present

## 2020-08-01 DIAGNOSIS — L231 Allergic contact dermatitis due to adhesives: Secondary | ICD-10-CM

## 2020-08-01 DIAGNOSIS — I10 Essential (primary) hypertension: Secondary | ICD-10-CM | POA: Diagnosis not present

## 2020-08-01 MED ORDER — PREDNISONE 50 MG PO TABS: ORAL_TABLET | ORAL | 0 refills | Status: DC

## 2020-08-01 MED ORDER — PREDNISONE 50 MG PO TABS
60.0000 mg | ORAL_TABLET | Freq: Every day | ORAL | Status: DC
  Administered 2020-08-01: 60 mg via ORAL
  Filled 2020-08-01: qty 1

## 2020-08-01 NOTE — Discharge Instructions (Addendum)
Cool compresses  Benadryl every 4 hours

## 2020-08-01 NOTE — ED Triage Notes (Signed)
Pt had last extensions placed yesterday at 1430. Swelling a few hours later, pt took lashes off.   Took benadryl last night but woke up with more swelling to bilateral eyes, blurred vision to left eye and "tingling" to throat. Denies trouble breathing or swallowing.

## 2020-08-02 NOTE — ED Provider Notes (Signed)
[facility] EMERGENCY DEPARTMENT Provider Note   CSN: [id number] Arrival date & time: 08/01/20  9417     History Chief Complaint  Patient presents with  . Allergic Reaction    Laura Andersen is a 30 y.o. female.  Pt report she had lash extensions placed,  Pt reports eyelids are swollen and painful  Pt took benadryl last pm with some relief.  The history is provided by the patient. No language interpreter was used.  Allergic Reaction Severity:  Moderate Prior allergic episodes:  No prior episodes Relieved by:  Nothing Worsened by:  Nothing      Past Medical History:  Diagnosis Date  . Furuncle of groin 02/06/2019  . Herpes   . Renal disorder    kidney stones    Patient Active Problem List   Diagnosis Date Noted  . Syncope 05/05/2020  . Encounter for medical examination to establish care 04/08/2020  . Essential (primary) hypertension 04/08/2020  . S/P cosmetic plastic surgery 04/08/2020  . Obesity (BMI 30.0-34.9) 04/08/2020  . Herpes simplex 02/06/2019  . Nexplanon insertion 12/15/2016    Past Surgical History:  Procedure Laterality Date  . COSMETIC SURGERY N/A    Phreesia 04/07/2020  . DILATION AND CURETTAGE OF UTERUS       OB History    Gravida  2   Para  2   Term  2   Preterm      AB      Living  2     SAB      IAB      Ectopic      Multiple      Live Births  2           Family History  Problem Relation Age of Onset  . Cancer Maternal Grandmother   . Asthma Maternal Grandfather   . Diabetes Father   . Heart attack Father   . Asthma Son     Social History   Tobacco Use  . Smoking status: Former Smoker    Packs/day: 0.25    Types: Cigarettes    Quit date: 02/10/2020    Years since quitting: 0.4  . Smokeless tobacco: Never Used  Vaping Use  . Vaping Use: Never used  Substance Use Topics  . Alcohol use: Yes    Comment: occ  . Drug use: No    Home Medications Prior to Admission medications   Medication Sig  Start Date End Date Taking? Authorizing Provider  predniSONE (DELTASONE) 50 MG tablet One tablet a day 08/01/20  Yes Elson Areas, PA-C  amLODipine (NORVASC) 5 MG tablet Take 1 tablet (5 mg total) by mouth daily. 04/08/20   Anabel Halon, MD    Allergies    Amoxicillin  Review of Systems   Review of Systems  Eyes: Positive for redness. Negative for photophobia, discharge, itching and visual disturbance.  All other systems reviewed and are negative.   Physical Exam Updated Vital Signs BP 133/88   Pulse 84   Temp 98.3 F (36.8 C) (Oral)   Resp 18   Ht 5\' 6"  (1.676 m)   Wt 89.4 kg   SpO2 98%   BMI 31.80 kg/m   Physical Exam Vitals and nursing note reviewed.  Constitutional:      Appearance: She is well-developed.  HENT:     Head: Normocephalic.  Eyes:     Extraocular Movements: Extraocular movements intact.     Pupils: Pupils are equal, round, and reactive to  light.     Comments: Swollen bilat eyelids.    Pulmonary:     Effort: Pulmonary effort is normal.  Abdominal:     General: There is no distension.  Musculoskeletal:        General: Normal range of motion.     Cervical back: Normal range of motion.  Neurological:     Mental Status: She is alert and oriented to person, place, and time.  Psychiatric:        Mood and Affect: Mood normal.     ED Results / Procedures / Treatments   Labs (all labs ordered are listed, but only abnormal results are displayed) Labs Reviewed - No data to display  EKG None  Radiology No results found.  Procedures Procedures   Medications Ordered in ED Medications - No data to display  ED Course  I have reviewed the triage vital signs and the nursing notes.  Pertinent labs & imaging results that were available during my care of the patient were reviewed by me and considered in my medical decision making (see chart for details).    MDM Rules/Calculators/A&P                          MDM:  Pt has removed lashes and  soaked off glue.  Pt advised to continue benadryl and is given prednisone.  Final Clinical Impression(s) / ED Diagnoses Final diagnoses:  Allergic reaction, initial encounter  Allergic contact dermatitis due to adhesives    Rx / DC Orders ED Discharge Orders         Ordered    predniSONE (DELTASONE) 50 MG tablet        08/01/20 4742        An After Visit Summary was printed and given to the patient.    Elson Areas, PA-C 08/02/20 [id number]    Rozelle Logan, DO 08/02/20 1114

## 2020-08-06 ENCOUNTER — Other Ambulatory Visit: Payer: Self-pay | Admitting: Women's Health

## 2020-08-06 MED ORDER — MEGESTROL ACETATE 40 MG PO TABS: ORAL_TABLET | ORAL | 1 refills | Status: DC

## 2020-12-03 ENCOUNTER — Other Ambulatory Visit: Payer: Self-pay

## 2020-12-03 ENCOUNTER — Encounter: Payer: Self-pay | Admitting: Adult Health

## 2020-12-03 ENCOUNTER — Ambulatory Visit: Payer: Medicaid Other | Admitting: Adult Health

## 2020-12-03 VITALS — BP 131/88 | HR 95 | Ht 66.0 in | Wt 203.0 lb

## 2020-12-03 DIAGNOSIS — Z3046 Encounter for surveillance of implantable subdermal contraceptive: Secondary | ICD-10-CM | POA: Diagnosis not present

## 2020-12-03 DIAGNOSIS — N926 Irregular menstruation, unspecified: Secondary | ICD-10-CM

## 2020-12-03 DIAGNOSIS — Z30011 Encounter for initial prescription of contraceptive pills: Secondary | ICD-10-CM | POA: Insufficient documentation

## 2020-12-03 MED ORDER — LO LOESTRIN FE 1 MG-10 MCG / 10 MCG PO TABS: 1.0000 | ORAL_TABLET | Freq: Every day | ORAL | 11 refills | Status: DC

## 2020-12-03 NOTE — Progress Notes (Deleted)
  Subjective:     Patient ID: Laura Andersen, female   DOB: Jun 25, 1990, 30 y.o.   MRN: [id number]  HPI  Lab Results  Component Value Date   DIAGPAP  02/19/2020    - Negative for Intraepithelial Lesions or Malignancy (NILM)   DIAGPAP - Benign reactive/reparative changes 02/19/2020   HPV NOT DETECTED 11/24/2016   HPVHIGH Negative 02/19/2020    Review of Systems     Objective:   Physical Exam     Assessment:     ***    Plan:     ***

## 2020-12-03 NOTE — Progress Notes (Signed)
  Subjective:     Patient ID: Laura Andersen, female   DOB: 02-Apr-1991, 30 y.o.   MRN: [id number]  HPI Laura Andersen is a 30 year old black female,single, G2P2 in for nexplanon removal, having irregular bleeding and tired megace, wants OCs. Lab Results  Component Value Date   DIAGPAP  02/19/2020    - Negative for Intraepithelial Lesions or Malignancy (NILM)   DIAGPAP - Benign reactive/reparative changes 02/19/2020   HPV NOT DETECTED 11/24/2016   HPVHIGH Negative 02/19/2020   PCP is Dr Allena Katz.  Review of Systems Irregular bleeding with nexplanonm, for removal Reviewed past medical,surgical, social and family history. Reviewed medications and allergies.     Objective:   Physical Exam BP 131/88 (BP Location: Right Arm, Patient Position: Sitting, Cuff Size: Normal)   Pulse 95   Ht 5\' 6"  (1.676 m)   Wt 203 lb (92.1 kg)   BMI 32.77 kg/m  Conset signed, time out called,Left arm cleansed with betadine, and injected with 1.5 cc 2% lidocaine and waited til numb.Under sterile technique a #11 blade was used to make small vertical incision, and a curved forceps was used to easily remove rod. Steri strips applied. Pressure dressing applied.      [facility] - 12/03/20 1352       Pregnancy Intention Screening   Does the patient want to become pregnant in the next year? No    Does the patient's partner want to become pregnant in the next year? No    Would the patient like to discuss contraceptive options today? Yes      Contraception Wrap Up   Current Method Hormonal Implant    End Method Oral Contraceptive    Contraception Counseling Provided Yes             Assessment:     1. Nexplanon removal Use condoms x 4 weeks, keep clean and dry x 24 hours, no heavy lifting, keep steri strips on x 72 hours, Keep pressure dressing on x 24 hours. Follow up prn problems.   2. Encounter for initial prescription of contraceptive pills Will rx Lo Loestrin, gave 1 pack to start today and use condoms  for 1 pack Meds ordered this encounter  Medications   Norethindrone-Ethinyl Estradiol-Fe Biphas (LO LOESTRIN FE) 1 MG-10 MCG / 10 MCG tablet    Sig: Take 1 tablet by mouth daily. Take 1 daily by mouth    Dispense:  28 tablet    Refill:  11    BIN 12/05/20, PCN CN, GRP [id number]    Order Specific Question:   Supervising Provider    Answer:   [id number] [[id number]]     3. Irregular bleeding Nexplanon removed, will start OCs     Plan:     Follow up in 3 months

## 2020-12-03 NOTE — Patient Instructions (Signed)
Use condoms x $ weeks, keep clean and dry x 24 hours, no heavy lifting, keep steri strips on x 72 hours, Keep pressure dressing on x 24 hours. Follow up prn problems. Start lo lo today, follow up in 3 months

## 2021-01-28 ENCOUNTER — Other Ambulatory Visit: Payer: Self-pay

## 2021-01-28 ENCOUNTER — Ambulatory Visit: Payer: Medicaid Other | Admitting: Nurse Practitioner

## 2021-01-28 ENCOUNTER — Encounter: Payer: Self-pay | Admitting: Nurse Practitioner

## 2021-01-28 VITALS — BP 130/82 | HR 105 | Temp 98.8°F | Ht 66.0 in | Wt 207.0 lb

## 2021-01-28 DIAGNOSIS — T2220XA Burn of second degree of shoulder and upper limb, except wrist and hand, unspecified site, initial encounter: Secondary | ICD-10-CM | POA: Insufficient documentation

## 2021-01-28 MED ORDER — IBUPROFEN 600 MG PO TABS: 600.0000 mg | ORAL_TABLET | Freq: Three times a day (TID) | ORAL | 0 refills | Status: DC | PRN

## 2021-01-28 MED ORDER — SILVER SULFADIAZINE 1 % EX CREA: 1.0000 "application " | TOPICAL_CREAM | Freq: Every day | CUTANEOUS | 0 refills | Status: DC

## 2021-01-28 NOTE — Progress Notes (Signed)
Acute Office Visit  Subjective:    Patient ID: Laura Andersen, female    DOB: Jan 21, 1991, 30 y.o.   MRN: [id number]  Chief Complaint  Patient presents with   Burn    Grease burn on R arm x1 day ago, several blisters and warm to touch, very painful.    Burn  Patient is in today for for burn to right arm yesterday that resulted in a large blister to her right wrist. Pain is 7/10 currently.  Past Medical History:  Diagnosis Date   Furuncle of groin 02/06/2019   Herpes    Renal disorder    kidney stones    Past Surgical History:  Procedure Laterality Date   COSMETIC SURGERY N/A    [employer] 04/07/2020   DILATION AND CURETTAGE OF UTERUS      Family History  Problem Relation Age of Onset   Cancer Maternal Grandmother    Asthma Maternal Grandfather    Diabetes Father    Heart attack Father    Asthma Son     Social History   Socioeconomic History   Marital status: Single    Spouse name: Not on file   Number of children: Not on file   Years of education: Not on file   Highest education level: Not on file  Occupational History   Not on file  Tobacco Use   Smoking status: Former    Packs/day: 0.25    Types: Cigarettes    Quit date: 02/10/2020    Years since quitting: 0.9   Smokeless tobacco: Never  Vaping Use   Vaping Use: Never used  Substance and Sexual Activity   Alcohol use: Yes    Comment: occ   Drug use: No   Sexual activity: Yes    Birth control/protection: Pill, Condom  Other Topics Concern   Not on file  Social History Narrative   Not on file   Social Determinants of Health   Financial Resource Strain: Low Risk    Difficulty of Paying Living Expenses: Not very hard  Food Insecurity: Food Insecurity Present   Worried About Running Out of Food in the Last Year: Sometimes true   Ran Out of Food in the Last Year: Never true  Transportation Needs: No Transportation Needs   Lack of Transportation (Medical): No   Lack of Transportation  (Non-Medical): No  Physical Activity: Sufficiently Active   Days of Exercise per Week: 4 days   Minutes of Exercise per Session: 60 min  Stress: Stress Concern Present   Feeling of Stress : To some extent  Social Connections: Unknown   Frequency of Communication with Friends and Family: More than three times a week   Frequency of Social Gatherings with Friends and Family: Once a week   Attends Religious Services: 1 to 4 times per year   Active Member of [employer] or Organizations: No   Attends Engineer, structural: Never   Marital Status: Not on file  Intimate Partner Violence: Not At Risk   Fear of Current or Ex-Partner: No   Emotionally Abused: No   Physically Abused: No   Sexually Abused: No    Outpatient Medications Prior to Visit  Medication Sig Dispense Refill   amLODipine (NORVASC) 5 MG tablet Take 1 tablet (5 mg total) by mouth daily. 30 tablet 3   Norethindrone-Ethinyl Estradiol-Fe Biphas (LO LOESTRIN FE) 1 MG-10 MCG / 10 MCG tablet Take 1 tablet by mouth daily. Take 1 daily by mouth 28 tablet 11  No facility-administered medications prior to visit.    Allergies  Allergen Reactions   Amoxicillin Itching    Review of Systems  Constitutional: Negative.   Respiratory: Negative.    Cardiovascular: Negative.   Skin:        Burn to right wrist  Psychiatric/Behavioral: Negative.        Objective:    Physical Exam Constitutional:      Appearance: Normal appearance.  Skin:    Comments: Second degree burn to approx 1% body surface area; maybe 2/3 of the circumference of her right wrist 3-4 inches wide  Neurological:     Mental Status: She is alert.    BP 130/82 (BP Location: Left Arm, Patient Position: Sitting, Cuff Size: Large)   Pulse (!) 105   Temp 98.8 F (37.1 C) (Oral)   Ht 5\' 6"  (1.676 m)   Wt 207 lb (93.9 kg)   SpO2 95%   BMI 33.41 kg/m  Wt Readings from Last 3 Encounters:  01/28/21 207 lb (93.9 kg)  12/03/20 203 lb (92.1 kg)  08/01/20 197  lb (89.4 kg)    Health Maintenance Due  Topic Date Due   Hepatitis C Screening  Never done    There are no preventive care reminders to display for this patient.   No results found for: TSH Lab Results  Component Value Date   WBC 11.8 (H) 02/19/2020   HGB 13.6 02/19/2020   HCT 38.7 02/19/2020   MCV 91 02/19/2020   PLT 453 (H) 02/19/2020   Lab Results  Component Value Date   NA 139 02/19/2020   K 4.2 02/19/2020   CO2 23 02/19/2020   GLUCOSE 68 02/19/2020   BUN 8 02/19/2020   CREATININE 0.60 02/19/2020   BILITOT <0.2 02/19/2020   ALKPHOS 108 02/19/2020   AST 21 02/19/2020   ALT 26 02/19/2020   PROT 7.1 02/19/2020   ALBUMIN 4.5 02/19/2020   CALCIUM 9.6 02/19/2020   ANIONGAP 7 03/03/2019   No results found for: CHOL No results found for: HDL No results found for: LDLCALC No results found for: TRIG No results found for: CHOLHDL No results found for: 05/03/2019     Assessment & Plan:   Problem List Items Addressed This Visit       Musculoskeletal and Integument   Second degree burn of right arm, initial encounter - Primary    -blister still intact; we discussed leaving that intact as long as possible -can wrap with gauze for cushion, but after blister pops, keep it wrapped -change bandages daily and PRN if soiled -Rx. Ibuprofen for pain and silvadene for burn treatment      Relevant Medications   silver sulfADIAZINE (SILVADENE) 1 % cream   ibuprofen (ADVIL) 600 MG tablet     Meds ordered this encounter  Medications   silver sulfADIAZINE (SILVADENE) 1 % cream    Sig: Apply 1 application topically daily.    Dispense:  50 g    Refill:  0   ibuprofen (ADVIL) 600 MG tablet    Sig: Take 1 tablet (600 mg total) by mouth every 8 (eight) hours as needed.    Dispense:  30 tablet    Refill:  0     [id number], NP

## 2021-01-28 NOTE — Assessment & Plan Note (Signed)
-  blister still intact; we discussed leaving that intact as long as possible -can wrap with gauze for cushion, but after blister pops, keep it wrapped -change bandages daily and PRN if soiled -Rx. Ibuprofen for pain and silvadene for burn treatment

## 2021-03-04 ENCOUNTER — Ambulatory Visit: Payer: Medicaid Other | Admitting: Adult Health

## 2021-05-23 NOTE — L&D Delivery Note (Signed)
[facility] Delivery Note  Laura Andersen is a 31 y.o. [id number] s/p SVD at [redacted]w[redacted]d. She was admitted for IOL for cHTN.   ROM: 2h 56m with clear fluid GBS Status: Positive/-- (12/05 1130) Maximum Maternal Temperature: Temp (24hrs), Avg:98.1 F (36.7 C), Min:97.8 F (36.6 C), Max:98.3 F (36.8 C)    Labor Progress: Patient arrived at 1 cm dilation and was induced with cytotec, FB, pitocin, AROM.   Delivery Date/Time: 05/11/2022 at 1316 Delivery: Called to room and patient was complete and pushing. Head delivered in direct OA position. No nuchal cord present. Shoulder and body delivered in usual fashion. Infant with spontaneous cry, placed on mother's abdomen, dried and stimulated. Cord clamped x 2 after 1-minute delay, and cut by FOB. Cord blood drawn. Placenta delivered spontaneously with gentle cord traction. Fundus firm with massage and Pitocin. Labia, perineum, vagina, and cervix inspected with no lacerations.   Placenta: Spontaneous, intact, 3 vessel cord  Complications: none Lacerations: none EBL: 11 ml  Analgesia: epidural in place    Infant: APGAR (1 MIN): 8   APGAR (5 MINS): 9   APGAR (10 MINS):    Weight: 3210 g  Derrel Nip, MD  OB fellow  05/11/2022 2:16 PM

## 2021-09-04 ENCOUNTER — Encounter (HOSPITAL_COMMUNITY): Payer: Self-pay | Admitting: Emergency Medicine

## 2021-09-04 ENCOUNTER — Emergency Department (HOSPITAL_COMMUNITY)
Admission: EM | Admit: 2021-09-04 | Discharge: 2021-09-04 | Disposition: A | Discharge status: Home / Self Care | Payer: Medicaid Other | Attending: Emergency Medicine | Admitting: Emergency Medicine

## 2021-09-04 DIAGNOSIS — H5789 Other specified disorders of eye and adnexa: Secondary | ICD-10-CM | POA: Diagnosis present

## 2021-09-04 DIAGNOSIS — Z20822 Contact with and (suspected) exposure to covid-19: Secondary | ICD-10-CM | POA: Diagnosis not present

## 2021-09-04 DIAGNOSIS — T7840XA Allergy, unspecified, initial encounter: Secondary | ICD-10-CM | POA: Diagnosis not present

## 2021-09-04 LAB — RESP PANEL BY RT-PCR (FLU A&B, COVID) ARPGX2
Influenza A by PCR: NEGATIVE
Influenza B by PCR: NEGATIVE
SARS Coronavirus 2 by RT PCR: NEGATIVE

## 2021-09-04 LAB — GROUP A STREP BY PCR: Group A Strep by PCR: NOT DETECTED

## 2021-09-04 MED ORDER — LORATADINE 10 MG PO TABS
10.0000 mg | ORAL_TABLET | Freq: Once | ORAL | Status: AC
  Administered 2021-09-04: 10 mg via ORAL
  Filled 2021-09-04: qty 1

## 2021-09-04 MED ORDER — OLOPATADINE HCL 0.2 % OP SOLN: 1.0000 [drp] | Freq: Every day | OPHTHALMIC | 0 refills | Status: DC

## 2021-09-04 MED ORDER — PREDNISONE 50 MG PO TABS: 50.0000 mg | ORAL_TABLET | Freq: Every day | ORAL | 0 refills | Status: DC

## 2021-09-04 MED ORDER — PREDNISONE 10 MG PO TABS
60.0000 mg | ORAL_TABLET | Freq: Once | ORAL | Status: AC
Start: 2021-09-04 — End: 2021-09-04
  Administered 2021-09-04: 60 mg via ORAL
  Filled 2021-09-04: qty 1

## 2021-09-04 MED ORDER — LORATADINE 10 MG PO TABS: 10.0000 mg | ORAL_TABLET | Freq: Every day | ORAL | 0 refills | Status: DC | PRN

## 2021-09-04 NOTE — ED Triage Notes (Signed)
Pt reports itchy throat, swollen eyes. States she took theraflu and benadryl last night.  ?

## 2021-09-04 NOTE — ED Provider Notes (Signed)
?[facility] ?Provider Note ? ? ?CSN: [id number] ?Arrival date & time: 09/04/21  1003 ? ?  ? ?History ? ?Chief Complaint  ?Patient presents with  ? Eye Drainage  ? Allergies  ? ? ?Laura Andersen is a 31 y.o. female. ? ?Pt is a 31 yo female with no significant pmhx.  Pt said she has had cold sx for the past few days.  She took a  home covid test and it was negative.  Her eyes became very itchy yesterday.  She took a benadryl and it did not help.  She said her throat also feels itchy.  She doesn't normally have allergy issues. ? ? ?  ? ?Home Medications ?Prior to Admission medications   ?Medication Sig Start Date End Date Taking? Authorizing Provider  ?Chlorphen-Pseudoephed-APAP (THERAFLU FLU/COLD PO) Take 30 mLs by mouth daily as needed (cough).   Yes [provider]  ?diphenhydrAMINE (BENADRYL) 25 MG tablet Take 25 mg by mouth at bedtime as needed for allergies or itching.   Yes [provider]  ?loratadine (CLARITIN) 10 MG tablet Take 1 tablet (10 mg total) by mouth daily as needed for allergies or itching. 09/04/21  Yes Isla Pence, MD  ?Olopatadine HCl (PATADAY) 0.2 % SOLN Apply 1 drop to eye daily. 09/04/21  Yes Isla Pence, MD  ?predniSONE (DELTASONE) 50 MG tablet Take 1 tablet (50 mg total) by mouth daily with breakfast. 09/04/21  Yes Isla Pence, MD  ?   ? ?Allergies    ?Amoxicillin   ? ?Review of Systems   ?Review of Systems  ?HENT:  Positive for sore throat.   ?Eyes:  Positive for itching.  ?All other systems reviewed and are negative. ? ?Physical Exam ?Updated Vital Signs ?BP (!) 134/98   Pulse 88   Temp 98.1 ?F (36.7 ?C) (Oral)   Resp 16   Ht 5\' 6"  (1.676 m)   Wt 93 kg   SpO2 100%   BMI 33.09 kg/m?  ?Physical Exam ?Vitals and nursing note reviewed.  ?Constitutional:   ?   Appearance: Normal appearance.  ?HENT:  ?   Head: Normocephalic and atraumatic.  ?   Right Ear: External ear normal.  ?   Left Ear: External ear normal.  ?   Nose: Nose normal.   ?   Mouth/Throat:  ?   Mouth: Mucous membranes are moist.  ?   Pharynx: Oropharynx is clear.  ?Eyes:  ?   Extraocular Movements: Extraocular movements intact.  ?   Conjunctiva/sclera: Conjunctivae normal.  ?   Pupils: Pupils are equal, round, and reactive to light.  ?   Comments: Eyelids are swollen bilaterally  ?Cardiovascular:  ?   Rate and Rhythm: Normal rate and regular rhythm.  ?   Pulses: Normal pulses.  ?   Heart sounds: Normal heart sounds.  ?Pulmonary:  ?   Effort: Pulmonary effort is normal.  ?   Breath sounds: Normal breath sounds.  ?Abdominal:  ?   General: Abdomen is flat. Bowel sounds are normal.  ?   Palpations: Abdomen is soft.  ?Musculoskeletal:  ?   Cervical back: Normal range of motion and neck supple.  ?Skin: ?   General: Skin is warm.  ?   Capillary Refill: Capillary refill takes less than 2 seconds.  ?Neurological:  ?   General: No focal deficit present.  ?   Mental Status: She is alert and oriented to person, place, and time.  ?Psychiatric:     ?  Mood and Affect: Mood normal.     ?   Behavior: Behavior normal.  ? ? ?ED Results / Procedures / Treatments   ?Labs ?(all labs ordered are listed, but only abnormal results are displayed) ?Labs Reviewed  ?RESP PANEL BY RT-PCR (FLU A&B, COVID) [id number]  ?GROUP A STREP BY PCR  ? ? ?EKG ?None ? ?Radiology ?No results found. ? ?Procedures ?Procedures  ? ? ?Medications Ordered in ED ?Medications  ?loratadine (CLARITIN) tablet 10 mg (10 mg Oral Given 09/04/21 1025)  ?predniSONE (DELTASONE) tablet 60 mg (60 mg Oral Given 09/04/21 1025)  ? ? ?ED Course/ Medical Decision Making/ A&P ?  ?                        ?Medical Decision Making ?Risk ?OTC drugs. ?Prescription drug management. ? ? ?This patient presents to the ED for concern of allergic sx, this involves an extensive number of treatment options, and is a complaint that carries with it a high risk of complications and morbidity.  The differential diagnosis includes allergy, uri, covid/flu, strep ? ? ?Co  morbidities that complicate the patient evaluation ? ?none ? ? ?Additional history obtained: ? ?Additional history obtained from epic chart review ? ?Lab Tests: ? ?I Ordered, and personally interpreted labs.  The pertinent results include:  covid/flu/strep neg ? ? ?Medicines ordered and prescription drug management: ? ?I ordered medication including claritin and prednisone  for allergy sx  ?Reevaluation of the patient after these medicines showed that the patient improved ?I have reviewed the patients home medicines and have made adjustments as needed ? ? ? ?Critical Interventions: ? ?Allergy meds ? ? ? ?Problem List / ED Course: ? ?Allergic sx:  pt d/c with prednisone and claritin and pataday.  She is to return if worse.  F/u with pcp. ? ? ?Reevaluation: ? ?After the interventions noted above, I reevaluated the patient and found that they have :improved ? ? ?Social Determinants of Health: ? ?Lives at home ? ? ?Dispostion: ? ?After consideration of the diagnostic results and the patients response to treatment, I feel that the patent would benefit from discharge with outpatient f/u.   ? ? ? ? ? ? ? ?Final Clinical Impression(s) / ED Diagnoses ?Final diagnoses:  ?Allergic reaction, initial encounter  ? ? ?Rx / DC Orders ?ED Discharge Orders   ? ?      Ordered  ?  predniSONE (DELTASONE) 50 MG tablet  Daily with breakfast       ? 09/04/21 1116  ?  loratadine (CLARITIN) 10 MG tablet  Daily PRN       ? 09/04/21 1116  ?  Olopatadine HCl (PATADAY) 0.2 % SOLN  Daily       ? 09/04/21 1116  ? ?  ?  ? ?  ? ? ?  ?Isla Pence, MD ?09/04/21 1123 ? ?

## 2021-09-06 ENCOUNTER — Telehealth: Payer: Self-pay

## 2021-09-06 NOTE — Telephone Encounter (Signed)
Transition Care Management Follow-up Telephone Call ?Date of discharge and from where: 09/04/2021-[place]  ?How have you been since you were released from the hospital? Pt stated she is doing fine.  ?Any questions or concerns? No ? ?Items Reviewed: ?Did the pt receive and understand the discharge instructions provided? Yes  ?Medications obtained and verified? Yes  ?Other? No  ?Any new allergies since your discharge? No  ?Dietary orders reviewed? No ?Do you have support at home? Yes  ? ?Home Care and Equipment/Supplies: ?Were home health services ordered? not applicable ?If so, what is the name of the agency? N/A  ?Has the agency set up a time to come to the patient's home? not applicable ?Were any new equipment or medical supplies ordered?  No ?What is the name of the medical supply agency? N/A ?Were you able to get the supplies/equipment? not applicable ?Do you have any questions related to the use of the equipment or supplies? No ? ?Functional Questionnaire: (I = Independent and D = Dependent) ?ADLs: I ? ?Bathing/Dressing- I ? ?Meal Prep- I ? ?Eating- I ? ?Maintaining continence- I ? ?Transferring/Ambulation- I ? ?Managing Meds- I ? ?Follow up appointments reviewed: ? ?PCP Hospital f/u appt confirmed? Yes  Scheduled to see Dr. Allena Katz on 09/09/2021 @ 3:20pm. ?Specialist Hospital f/u appt confirmed? No   ?Are transportation arrangements needed? No  ?If their condition worsens, is the pt aware to call PCP or go to the Emergency Dept.? Yes ?Was the patient provided with contact information for the PCP's office or ED? Yes ?Was to pt encouraged to call back with questions or concerns? Yes  ?

## 2021-09-09 ENCOUNTER — Ambulatory Visit: Payer: Medicaid Other | Admitting: Internal Medicine

## 2021-09-09 ENCOUNTER — Encounter: Payer: Self-pay | Admitting: Internal Medicine

## 2021-09-09 VITALS — BP 128/82 | HR 115 | Resp 18 | Ht 66.0 in | Wt 209.0 lb

## 2021-09-09 DIAGNOSIS — Z09 Encounter for follow-up examination after completed treatment for conditions other than malignant neoplasm: Secondary | ICD-10-CM | POA: Diagnosis not present

## 2021-09-09 DIAGNOSIS — J302 Other seasonal allergic rhinitis: Secondary | ICD-10-CM | POA: Diagnosis not present

## 2021-09-09 MED ORDER — LORATADINE 10 MG PO TABS: 10.0000 mg | ORAL_TABLET | Freq: Every day | ORAL | 5 refills | Status: DC | PRN

## 2021-09-09 NOTE — Patient Instructions (Signed)
Please continue taking Claritin as prescribed for allergies. ? ?Please use Pataday for eye itching or redness. ?

## 2021-09-09 NOTE — Progress Notes (Signed)
? ?Acute Office Visit ? ?Subjective:  ? ? Patient ID: Laura Andersen, female    DOB: 01-Sep-1990, 31 y.o.   MRN: [id number] ? ?Chief Complaint  ?Patient presents with  ? Follow-up  ?  ER follow up pt had symptoms of common cold took benadryl and theraflu she felt a little better she had cold for 2 weeks she woke up 09-04-21 eyes were swollen was given steroid claritin and eye drops and this has helped feels like benadryl may have caused the swelling   ? ? ?HPI ?Patient is in today for f/u after recent ER visit for an allergic reaction.  She was having rhinitis/sinusitis initially, and tried taking Benadryl.  She later had swelling around the eyes and went to ER.  She was given prednisone and Claritin, which has improved her symptoms now.  She denies any recent chemical change at home, including soap, shampoo, lotion, etc.  She denied any lip swelling or dyspnea.  Denies any fever or chills. ? ?Past Medical History:  ?Diagnosis Date  ? Furuncle of groin 02/06/2019  ? Herpes   ? Renal disorder   ? kidney stones  ? ? ?Past Surgical History:  ?Procedure Laterality Date  ? COSMETIC SURGERY N/A   ? [employer] 04/07/2020  ? DILATION AND CURETTAGE OF UTERUS    ? ? ?Family History  ?Problem Relation Age of Onset  ? Cancer Maternal Grandmother   ? Asthma Maternal Grandfather   ? Diabetes Father   ? Heart attack Father   ? Asthma Son   ? ? ?Social History  ? ?Socioeconomic History  ? Marital status: Single  ?  Spouse name: Not on file  ? Number of children: Not on file  ? Years of education: Not on file  ? Highest education level: Not on file  ?Occupational History  ? Not on file  ?Tobacco Use  ? Smoking status: Former  ?  Packs/day: 0.25  ?  Types: Cigarettes  ?  Quit date: 02/10/2020  ?  Years since quitting: 1.5  ? Smokeless tobacco: Never  ?Vaping Use  ? Vaping Use: Never used  ?Substance and Sexual Activity  ? Alcohol use: Yes  ?  Comment: occ  ? Drug use: No  ? Sexual activity: Yes  ?  Birth control/protection: Pill,  Condom  ?Other Topics Concern  ? Not on file  ?Social History Narrative  ? Not on file  ? ?Social Determinants of Health  ? ?Financial Resource Strain: Not on file  ?Food Insecurity: Not on file  ?Transportation Needs: Not on file  ?Physical Activity: Not on file  ?Stress: Not on file  ?Social Connections: Not on file  ?Intimate Partner Violence: Not on file  ? ? ?Outpatient Medications Prior to Visit  ?Medication Sig Dispense Refill  ? Olopatadine HCl (PATADAY) 0.2 % SOLN Apply 1 drop to eye daily. 2.5 mL 0  ? loratadine (CLARITIN) 10 MG tablet Take 1 tablet (10 mg total) by mouth daily as needed for allergies or itching. 30 tablet 0  ? Chlorphen-Pseudoephed-APAP (THERAFLU FLU/COLD PO) Take 30 mLs by mouth daily as needed (cough). (Patient not taking: Reported on 09/09/2021)    ? diphenhydrAMINE (BENADRYL) 25 MG tablet Take 25 mg by mouth at bedtime as needed for allergies or itching. (Patient not taking: Reported on 09/09/2021)    ? predniSONE (DELTASONE) 50 MG tablet Take 1 tablet (50 mg total) by mouth daily with breakfast. (Patient not taking: Reported on 09/09/2021) 5 tablet 0  ? ?  No facility-administered medications prior to visit.  ? ? ?Allergies  ?Allergen Reactions  ? Amoxicillin Itching  ? Benadryl [Diphenhydramine] Swelling  ? ? ?Review of Systems  ?Constitutional:  Negative for chills and fever.  ?HENT:  Negative for congestion, sinus pressure, sinus pain and sore throat.   ?Eyes:  Positive for redness. Negative for pain and discharge.  ?Respiratory:  Negative for cough and shortness of breath.   ?Cardiovascular:  Negative for chest pain and palpitations.  ?Gastrointestinal:  Negative for abdominal pain, constipation, diarrhea, nausea and vomiting.  ?Endocrine: Negative for polydipsia and polyuria.  ?Genitourinary:  Negative for dysuria and hematuria.  ?Musculoskeletal:  Negative for neck pain and neck stiffness.  ?Skin:  Negative for rash.  ?Neurological:  Negative for dizziness, weakness and headaches.   ?Psychiatric/Behavioral:  Negative for agitation and behavioral problems.   ? ?   ?Objective:  ?  ?Physical Exam ?Vitals reviewed.  ?Constitutional:   ?   General: She is not in acute distress. ?   Appearance: She is not diaphoretic.  ?HENT:  ?   Head: Normocephalic and atraumatic.  ?   Nose: Nose normal.  ?   Mouth/Throat:  ?   Mouth: Mucous membranes are moist.  ?Eyes:  ?   General: No scleral icterus. ?   Extraocular Movements: Extraocular movements intact.  ?Cardiovascular:  ?   Rate and Rhythm: Normal rate and regular rhythm.  ?   Pulses: Normal pulses.  ?   Heart sounds: Normal heart sounds. No murmur heard. ?Pulmonary:  ?   Breath sounds: Normal breath sounds. No wheezing or rales.  ?Musculoskeletal:  ?   Cervical back: Neck supple. No tenderness.  ?   Right lower leg: No edema.  ?   Left lower leg: No edema.  ?Skin: ?   General: Skin is warm.  ?   Findings: No rash.  ?Neurological:  ?   General: No focal deficit present.  ?   Mental Status: She is alert and oriented to person, place, and time.  ?   Sensory: No sensory deficit.  ?   Motor: No weakness.  ?Psychiatric:     ?   Mood and Affect: Mood normal.     ?   Behavior: Behavior normal.  ? ? ?BP 128/82 (BP Location: Left Arm, Patient Position: Sitting, Cuff Size: Normal)   Pulse (!) 115   Resp 18   Ht 5\' 6"  (1.676 m)   Wt 209 lb (94.8 kg)   SpO2 97%   BMI 33.73 kg/m?  ?Wt Readings from Last 3 Encounters:  ?09/09/21 209 lb (94.8 kg)  ?09/04/21 205 lb (93 kg)  ?01/28/21 207 lb (93.9 kg)  ? ? ? ?   ?Assessment & Plan:  ? ?Problem List Items Addressed This Visit   ? ?Visit Diagnoses   ? ? Encounter for examination following treatment at hospital    -  Primary  ? Seasonal allergies   ?ER chart reviewed ?Her symptoms are likely due to seasonal allergies ?Now better with prednisone ?Continue Claritin as needed ?Pataday as needed for allergic conjunctivitis  ? Relevant Medications  ? loratadine (CLARITIN) 10 MG tablet  ? ?  ? ? ? ?Meds ordered this encounter   ?Medications  ? loratadine (CLARITIN) 10 MG tablet  ?  Sig: Take 1 tablet (10 mg total) by mouth daily as needed for allergies or itching.  ?  Dispense:  30 tablet  ?  Refill:  5  ? ? ? ?Loyd Marhefka K  Posey Pronto, MD ?

## 2021-10-06 ENCOUNTER — Ambulatory Visit (INDEPENDENT_AMBULATORY_CARE_PROVIDER_SITE_OTHER): Payer: Medicaid Other

## 2021-10-06 VITALS — BP 127/87 | HR 79 | Ht 66.0 in | Wt 214.0 lb

## 2021-10-06 DIAGNOSIS — Z3201 Encounter for pregnancy test, result positive: Secondary | ICD-10-CM

## 2021-10-06 DIAGNOSIS — Z3200 Encounter for pregnancy test, result unknown: Secondary | ICD-10-CM

## 2021-10-06 LAB — POCT URINE PREGNANCY: Preg Test, Ur: POSITIVE — AB

## 2021-10-06 NOTE — Progress Notes (Signed)
? ?  NURSE VISIT- PREGNANCY CONFIRMATION  ? ?SUBJECTIVE:  ?Laura Andersen is a 31 y.o. [id number] female at [redacted]w[redacted]d by certain LMP of Patient's last menstrual period was 07/17/2021 (exact date). Here for pregnancy confirmation.  Home pregnancy test: positive x 1   She reports nausea.  She is not taking prenatal vitamins.   ? ?OBJECTIVE:  ?BP 127/87 (BP Location: Left Arm, Patient Position: Sitting, Cuff Size: Large)   Pulse 79   Ht 5\' 6"  (1.676 m)   Wt 214 lb (97.1 kg)   LMP 07/17/2021 (Exact Date)   BMI 34.54 kg/m?   ?Appears well, in no apparent distress ? ?Results for orders placed or performed in visit on 10/06/21 (from the past 24 hour(s))  ?POCT urine pregnancy  ? Collection Time: 10/06/21  9:55 AM  ?Result Value Ref Range  ? Preg Test, Ur Positive (A) Negative  ? ? ?ASSESSMENT: ?Positive pregnancy test, [redacted]w[redacted]d by LMP   ? ?PLAN: ?Schedule for dating ultrasound in 1 week ?Prenatal vitamins: plans to begin OTC ASAP   ?Nausea medicines: not currently needed   ?OB packet given: Yes ? ?Vana Arif A Eris Breck  ?10/06/2021 ?9:59 AM  ?

## 2021-11-01 ENCOUNTER — Other Ambulatory Visit: Payer: Self-pay | Admitting: Obstetrics & Gynecology

## 2021-11-01 DIAGNOSIS — O3680X0 Pregnancy with inconclusive fetal viability, not applicable or unspecified: Secondary | ICD-10-CM

## 2021-11-03 ENCOUNTER — Ambulatory Visit (INDEPENDENT_AMBULATORY_CARE_PROVIDER_SITE_OTHER): Payer: Medicaid Other

## 2021-11-03 DIAGNOSIS — O3680X0 Pregnancy with inconclusive fetal viability, not applicable or unspecified: Secondary | ICD-10-CM

## 2021-11-03 DIAGNOSIS — Z3A15 15 weeks gestation of pregnancy: Secondary | ICD-10-CM

## 2021-11-03 NOTE — Progress Notes (Signed)
[place] 11+1 wks,single IUP,anterior placenta,normal ovaries,CRL 43.29 mm,FHR 159 bpm

## 2021-11-04 ENCOUNTER — Encounter: Payer: Self-pay | Admitting: *Deleted

## 2021-11-04 ENCOUNTER — Telehealth: Payer: Self-pay | Admitting: Women's Health

## 2021-11-04 NOTE — Telephone Encounter (Signed)
Patient called stating she thinks she may have bv according to her symptoms. I put her on the lab schedule tomorrow. Please advise.

## 2021-11-05 ENCOUNTER — Other Ambulatory Visit (HOSPITAL_COMMUNITY)
Admission: RE | Admit: 2021-11-05 | Discharge: 2021-11-05 | Disposition: A | Discharge status: Home / Self Care | Payer: Medicaid Other | Source: Ambulatory Visit | Attending: Obstetrics & Gynecology | Admitting: Obstetrics & Gynecology

## 2021-11-05 ENCOUNTER — Other Ambulatory Visit: Payer: Self-pay | Admitting: Adult Health

## 2021-11-05 ENCOUNTER — Other Ambulatory Visit (INDEPENDENT_AMBULATORY_CARE_PROVIDER_SITE_OTHER): Payer: Medicaid Other

## 2021-11-05 DIAGNOSIS — N898 Other specified noninflammatory disorders of vagina: Secondary | ICD-10-CM | POA: Insufficient documentation

## 2021-11-05 MED ORDER — VALACYCLOVIR HCL 1 G PO TABS: 1000.0000 mg | ORAL_TABLET | Freq: Every day | ORAL | 12 refills | Status: DC

## 2021-11-05 NOTE — Progress Notes (Signed)
   NURSE VISIT- VAGINITIS/STD/POC  SUBJECTIVE:  Laura Andersen is a 31 y.o. [id number] GYN patientfemale here for a vaginal swab for vaginitis screening.  She reports the following symptoms: odor and vulvar itching for 1 day. Denies abnormal vaginal bleeding, significant pelvic pain, fever, or UTI symptoms.  OBJECTIVE:  LMP 07/17/2021 (Exact Date)   Appears well, in no apparent distress  ASSESSMENT: Vaginal swab for vaginitis screening  PLAN: Self-collected vaginal probe for Gonorrhea, Chlamydia, Trichomonas, Bacterial Vaginosis, Yeast sent to lab Treatment: to be determined once results are received Follow-up as needed if symptoms persist/worsen, or new symptoms develop Pt also needs acyclovir sent in, she has started having outbreaks again. She has a history of HSV  Annamarie Dawley  11/05/2021 11:56 AM

## 2021-11-05 NOTE — Progress Notes (Signed)
Refilled valtrex  

## 2021-11-07 LAB — CERVICOVAGINAL ANCILLARY ONLY
Bacterial Vaginitis (gardnerella): NEGATIVE
Candida Glabrata: NEGATIVE
Candida Vaginitis: POSITIVE — AB
Chlamydia: NEGATIVE
Comment: NEGATIVE
Comment: NEGATIVE
Comment: NEGATIVE
Comment: NEGATIVE
Comment: NEGATIVE
Comment: NORMAL
Neisseria Gonorrhea: NEGATIVE
Trichomonas: NEGATIVE

## 2021-11-08 ENCOUNTER — Other Ambulatory Visit: Payer: Self-pay | Admitting: Adult Health

## 2021-11-08 MED ORDER — MICONAZOLE NITRATE 2 % VA CREA: 1.0000 | TOPICAL_CREAM | Freq: Every day | VAGINAL | 0 refills | Status: DC

## 2021-11-08 NOTE — Progress Notes (Signed)
+  yeast on vaginal swab. Can use OTC monistat

## 2021-11-24 ENCOUNTER — Encounter: Payer: Medicaid Other | Admitting: Internal Medicine

## 2021-11-29 ENCOUNTER — Encounter: Payer: Self-pay | Admitting: Women's Health

## 2021-11-29 ENCOUNTER — Ambulatory Visit (INDEPENDENT_AMBULATORY_CARE_PROVIDER_SITE_OTHER): Payer: Medicaid Other | Admitting: Women's Health

## 2021-11-29 ENCOUNTER — Ambulatory Visit: Payer: Medicaid Other | Admitting: *Deleted

## 2021-11-29 VITALS — BP 139/84 | HR 107 | Wt 224.6 lb

## 2021-11-29 DIAGNOSIS — B009 Herpesviral infection, unspecified: Secondary | ICD-10-CM | POA: Diagnosis not present

## 2021-11-29 DIAGNOSIS — O10919 Unspecified pre-existing hypertension complicating pregnancy, unspecified trimester: Secondary | ICD-10-CM | POA: Diagnosis not present

## 2021-11-29 DIAGNOSIS — O0992 Supervision of high risk pregnancy, unspecified, second trimester: Secondary | ICD-10-CM

## 2021-11-29 DIAGNOSIS — O0990 Supervision of high risk pregnancy, unspecified, unspecified trimester: Secondary | ICD-10-CM | POA: Insufficient documentation

## 2021-11-29 DIAGNOSIS — Z6834 Body mass index (BMI) 34.0-34.9, adult: Secondary | ICD-10-CM

## 2021-11-29 DIAGNOSIS — Z3A14 14 weeks gestation of pregnancy: Secondary | ICD-10-CM | POA: Diagnosis not present

## 2021-11-29 DIAGNOSIS — Z363 Encounter for antenatal screening for malformations: Secondary | ICD-10-CM

## 2021-11-29 LAB — POCT URINALYSIS DIPSTICK OB
Blood, UA: NEGATIVE
Glucose, UA: NEGATIVE
Ketones, UA: NEGATIVE
Leukocytes, UA: NEGATIVE
Nitrite, UA: NEGATIVE
POC,PROTEIN,UA: NEGATIVE

## 2021-11-29 MED ORDER — ASPIRIN 81 MG PO TBEC: 162.0000 mg | DELAYED_RELEASE_TABLET | Freq: Every day | ORAL | 2 refills | Status: DC

## 2021-11-29 NOTE — Progress Notes (Signed)
INITIAL OBSTETRICAL VISIT Patient name: Laura Andersen MRN [id number]  Date of birth: Aug 11, 1990 Chief Complaint:   Initial Prenatal Visit  History of Present Illness:   Laura Andersen is a 31 y.o. [id number] African-American female at [redacted]w[redacted]d by [place] at 29 weeks with an Estimated Date of Delivery: 05/24/22 being seen today for her initial obstetrical visit.   Patient's last menstrual period was 07/17/2021 (exact date). Her obstetrical history is significant for  term uncomplicated SVB x 2, SAB x 1, EAB x 1 .   Today she reports  some nausea, no vomiting . H/O cHTN, stopped meds about a year ago  Started valtrex 1gm daily on 11/05/21, had outbreak about 2wks later, didn't miss/skip any pills Last pap 02/19/20. Results were: NILM w/ HRHPV negative     11/29/2021    9:54 AM 09/09/2021    3:07 PM 01/28/2021    1:31 PM 05/05/2020    4:10 PM 04/08/2020    8:54 AM  Depression screen PHQ 2/9  Decreased Interest 2 0 0 0 0  Down, Depressed, Hopeless 0 0 0 0 0  PHQ - 2 Score 2 0 0 0 0  Altered sleeping 2      Tired, decreased energy 2      Change in appetite 0      Feeling bad or failure about yourself  0      Trouble concentrating 0      Moving slowly or fidgety/restless 0      Suicidal thoughts 0      PHQ-9 Score 6            11/29/2021    9:54 AM 02/19/2020    1:40 PM  GAD 7 : Generalized Anxiety Score  Nervous, Anxious, on Edge 0 0  Control/stop worrying 0 0  Worry too much - different things 0 0  Trouble relaxing 1 0  Restless 0 0  Easily annoyed or irritable 1 0  Afraid - awful might happen 0 0  Total GAD 7 Score 2 0  Anxiety Difficulty  Not difficult at all     Review of Systems:   Pertinent items are noted in HPI Denies cramping/contractions, leakage of fluid, vaginal bleeding, abnormal vaginal discharge w/ itching/odor/irritation, headaches, visual changes, shortness of breath, chest pain, abdominal pain, severe nausea/vomiting, or problems with urination or bowel  movements unless otherwise stated above.  Pertinent History Reviewed:  Reviewed past medical,surgical, social, obstetrical and family history.  Reviewed problem list, medications and allergies. OB History  Gravida Para Term Preterm AB Living  5 2 2   2 2   SAB IAB Ectopic Multiple Live Births  1       2    # Outcome Date GA Lbr Len/2nd Weight Sex Delivery Anes PTL Lv  5 Current           4 Term 08/21/10 [redacted]w[redacted]d  7 lb 8 oz (3.402 kg) M Vag-Spont EPI N LIV  3 AB 2011          2 Term 05/14/06 [redacted]w[redacted]d  6 lb 9 oz (2.977 kg) M Vag-Spont EPI N LIV  1 SAB 2005           Physical Assessment:   Vitals:   11/29/21 0938  BP: 139/84  Pulse: (!) 107  Weight: 224 lb 9.6 oz (101.9 kg)  Body mass index is 36.25 kg/m.       Physical Examination:  General appearance - well appearing, and in no  distress  Mental status - alert, oriented to person, place, and time  Psych:  She has a normal mood and affect  Skin - warm and dry, normal color, no suspicious lesions noted  Chest - effort normal, all lung fields clear to auscultation bilaterally  Heart - normal rate and regular rhythm  Abdomen - soft, nontender  Extremities:  No swelling or varicosities noted  Thin prep pap is not done   Chaperone: N/A    TODAY'S FHR via doppler: 142 via doppler  Results for orders placed or performed in visit on 11/29/21 (from the past 24 hour(s))  POC Urinalysis Dipstick OB   Collection Time: 11/29/21 10:05 AM  Result Value Ref Range   Color, UA     Clarity, UA     Glucose, UA Negative Negative   Bilirubin, UA     Ketones, UA neg    Spec Grav, UA     Blood, UA neg    pH, UA     POC,PROTEIN,UA Negative Negative, Trace, Small (1+), Moderate (2+), Large (3+), 4+   Urobilinogen, UA     Nitrite, UA neg    Leukocytes, UA Negative Negative   Appearance     Odor      Assessment & Plan:  1) High-Risk Pregnancy [id number] at [redacted]w[redacted]d with an Estimated Date of Delivery: 05/24/22   2) Initial OB visit  3)  CHTN>stopped meds 19yr ago, start ASA 162mg  daily, baseline labs today, check bp weekly, if >140/90 let know  4) HSV> on valtrex 1gm daily, had outbreak 2wks into starting valtrex. If happens again can switch to acyclovir or increase valtrex to BID  Meds:  Meds ordered this encounter  Medications   aspirin EC 81 MG tablet    Sig: Take 2 tablets (162 mg total) by mouth daily. Swallow whole.    Dispense:  180 tablet    Refill:  2    Order Specific Question:   Supervising Provider    Answer:   [place] H [[id number]]    Initial labs obtained Continue prenatal vitamins Reviewed n/v relief measures and warning s/s to report Reviewed recommended weight gain based on pre-gravid BMI Encouraged well-balanced diet Genetic & carrier screening discussed: requests Panorama, declines NT/IT, AFP, and Horizon  Ultrasound discussed; fetal survey: requested CCNC completed> form faxed if has or is planning to apply for medicaid The nature of [place] [facility] with multiple MDs and other Advanced Practice Providers was explained to patient; also emphasized that fellows, residents, and students are part of our team. Does have home bp cuff. Office bp cuff given: no. Rx sent: n/a. Check bp weekly, let [employer] know if consistently >140/90.    Indications for early A1C (per uptodate) BMI >=25 (>=23 in Asian women) AND one of the following High-risk race/ethnicity (eg, African American, Latino, Native American, [place] American, Pacific Islander) Yes HTN or on therapy for hypertension Yes  Follow-up: Return in about 4 weeks (around 12/27/2021) for HROB, 02/26/2022, MD or CNM, in person.   Orders Placed This Encounter  Procedures   Urine Culture   GC/Chlamydia Probe Amp   VZ:CHYIFOY OB Comp + 14 Wk   Protein / creatinine ratio, urine   CBC/D/Plt+RPR+Rh+ABO+RubIgG...   Comprehensive metabolic panel   Panorama Prenatal Test Full Panel   Hgb Fractionation Cascade   Hemoglobin A1c   POC  Urinalysis Dipstick OB    [place] CNM, [facility] 11/29/2021 10:41 AM

## 2021-11-29 NOTE — Patient Instructions (Signed)
Laura Andersen, thank you for choosing our office today! We appreciate the opportunity to meet your healthcare needs. You may receive a short survey by mail, e-mail, or through [employer]. If you are happy with your care we would appreciate if you could take just a few minutes to complete the survey questions. We read all of your comments and take your feedback very seriously. Thank you again for choosing our office.  [facility] [employer] Team at [facility] at [facility] ([address] [place], [place] [address]) Entrance C, located off of [address] Free 24/7 valet parking  Go to [email or web address] to register for FREE online childbirth classes  Call the office [phone]) or go to [facility] if: You begin to severe cramping Your water breaks.  Sometimes it is a big gush of fluid, sometimes it is just a trickle that keeps getting your panties wet or running down your legs You have vaginal bleeding.  It is normal to have a small amount of spotting if your cervix was checked.   [facility] Pediatricians/Family Doctors [facility]): [address] Colette Ribas, [phone]           [facility]: [address], [phone]                [facility] Medicine [facility]): [address], [phone] (call to ask if accepting patients) [facility] Department: [address], [place], [phone]    [facility] Pediatricians/Family Doctors [facility]): [phone] [address], [phone] [facility]: [address] [place], [phone] [facility]: [address], [phone]  [facility] Doctors  [facility]): [phone] [facility]: [address] [phone]   [facility] Doctors [facility]: [address] [phone]  [facility] Doctors  [facility]: [phone], [phone]  Home Blood Pressure Monitoring for Patients   Your provider has recommended that you check your blood pressure (BP) at least once a week at home. If you do not have a blood pressure cuff at home, one will be provided for you. Contact your provider if you have not received your monitor within 1 week.   Helpful Tips for Accurate Home Blood Pressure Checks  Don't smoke, exercise, or drink caffeine 30 minutes before checking your BP Use the restroom before checking your BP (a full bladder can raise your pressure) Relax in a comfortable upright chair Feet on the ground Left arm resting comfortably on a flat surface at the level of your heart Legs uncrossed Back supported Sit quietly and don't talk Place the cuff on your bare arm Adjust snuggly, so that only two fingertips can fit between your skin and the top of the cuff Check 2 readings separated by at least one minute Keep a log of your BP readings For a visual, please reference this diagram: http://ccnc.care/bpdiagram  Provider Name: [facility]     Phone: [phone]  Zone 1: ALL CLEAR  Continue to monitor your symptoms:  BP reading is less than 140 (top number) or less than 90 (bottom number)  No right upper stomach pain No headaches or seeing spots No feeling nauseated or throwing up No swelling in face and hands  Zone 2: CAUTION Call your doctor's office for any of the following:  BP reading is greater than 140 (top number) or greater than  90 (bottom number)  Stomach pain under your ribs in the middle or right side Headaches or seeing spots Feeling nauseated or throwing up Swelling in face and hands  Zone 3: EMERGENCY  Seek immediate medical care if you have any of the following:  BP reading is greater than160 (top number) or greater than 110 (bottom number) Severe headaches not improving with Tylenol Serious difficulty catching your breath Any worsening symptoms from  Zone 2     Second Trimester of Pregnancy The second trimester is from week 14 through week 27 (months 4 through 6). The second trimester is often a time when you feel your best. Your body has adjusted to being pregnant, and you begin to feel better physically. Usually, morning sickness has lessened or quit completely, you may have more energy, and you may have an increase in appetite. The second trimester is also a time when the fetus is growing rapidly. At the end of the sixth month, the fetus is about 9 inches long and weighs about 1 pounds. You will likely begin to feel the baby move (quickening) between 16 and 20 weeks of pregnancy. Body changes during your second trimester Your body continues to go through many changes during your second trimester. The changes vary from woman to woman. Your weight will continue to increase. You will notice your lower abdomen bulging out. You may begin to get stretch marks on your hips, abdomen, and breasts. You may develop headaches that can be relieved by medicines. The medicines should be approved by your health care provider. You may urinate more often because the fetus is pressing on your bladder. You may develop or continue to have heartburn as a result of your pregnancy. You may develop constipation because certain hormones are causing the muscles that push waste through your intestines to slow down. You may develop hemorrhoids or swollen, bulging veins (varicose veins). You may have back pain. This is caused by: Weight gain. Pregnancy hormones that are relaxing the joints in your pelvis. A shift in weight and the muscles that support your balance. Your breasts will continue to grow and they will continue to become tender. Your gums may bleed and may be sensitive to brushing and flossing. Dark spots or blotches (chloasma, mask of pregnancy) may develop on your face. This will likely fade after the baby is born. A dark line from your belly button to  the pubic area (linea nigra) may appear. This will likely fade after the baby is born. You may have changes in your hair. These can include thickening of your hair, rapid growth, and changes in texture. Some women also have hair loss during or after pregnancy, or hair that feels dry or thin. Your hair will most likely return to normal after your baby is born.  What to expect at prenatal visits During a routine prenatal visit: You will be weighed to make sure you and the fetus are growing normally. Your blood pressure will be taken. Your abdomen will be measured to track your baby's growth. The fetal heartbeat will be listened to. Any test results from the previous visit will be discussed.  Your health care provider may ask you: How you are feeling. If you are feeling the baby move. If you have had any abnormal symptoms, such as leaking fluid, bleeding, severe headaches, or abdominal cramping. If you are using any tobacco products, including cigarettes, chewing tobacco, and electronic cigarettes. If you have any questions.  Other tests that may be performed during   your second trimester include: Blood tests that check for: Low iron levels (anemia). High blood sugar that affects pregnant women (gestational diabetes) between 24 and 28 weeks. Rh antibodies. This is to check for a protein on red blood cells (Rh factor). Urine tests to check for infections, diabetes, or protein in the urine. An ultrasound to confirm the proper growth and development of the baby. An amniocentesis to check for possible genetic problems. Fetal screens for spina bifida and Down syndrome. HIV (human immunodeficiency virus) testing. Routine prenatal testing includes screening for HIV, unless you choose not to have this test.  Follow these instructions at home: Medicines Follow your health care provider's instructions regarding medicine use. Specific medicines may be either safe or unsafe to take during  pregnancy. Take a prenatal vitamin that contains at least 600 micrograms (mcg) of folic acid. If you develop constipation, try taking a stool softener if your health care provider approves. Eating and drinking Eat a balanced diet that includes fresh fruits and vegetables, whole grains, good sources of protein such as meat, eggs, or tofu, and low-fat dairy. Your health care provider will help you determine the amount of weight gain that is right for you. Avoid raw meat and uncooked cheese. These carry germs that can cause birth defects in the baby. If you have low calcium intake from food, talk to your health care provider about whether you should take a daily calcium supplement. Limit foods that are high in fat and processed sugars, such as fried and sweet foods. To prevent constipation: Drink enough fluid to keep your urine clear or pale yellow. Eat foods that are high in fiber, such as fresh fruits and vegetables, whole grains, and beans. Activity Exercise only as directed by your health care provider. Most women can continue their usual exercise routine during pregnancy. Try to exercise for 30 minutes at least 5 days a week. Stop exercising if you experience uterine contractions. Avoid heavy lifting, wear low heel shoes, and practice good posture. A sexual relationship may be continued unless your health care provider directs you otherwise. Relieving pain and discomfort Wear a good support bra to prevent discomfort from breast tenderness. Take warm sitz baths to soothe any pain or discomfort caused by hemorrhoids. Use hemorrhoid cream if your health care provider approves. Rest with your legs elevated if you have leg cramps or low back pain. If you develop varicose veins, wear support hose. Elevate your feet for 15 minutes, 3-4 times a day. Limit salt in your diet. Prenatal Care Write down your questions. Take them to your prenatal visits. Keep all your prenatal visits as told by your health  care provider. This is important. Safety Wear your seat belt at all times when driving. Make a list of emergency phone numbers, including numbers for family, friends, the hospital, and police and fire departments. General instructions Ask your health care provider for a referral to a local prenatal education class. Begin classes no later than the beginning of month 6 of your pregnancy. Ask for help if you have counseling or nutritional needs during pregnancy. Your health care provider can offer advice or refer you to specialists for help with various needs. Do not use hot tubs, steam rooms, or saunas. Do not douche or use tampons or scented sanitary pads. Do not cross your legs for long periods of time. Avoid cat litter boxes and soil used by cats. These carry germs that can cause birth defects in the baby and possibly loss of the   fetus by miscarriage or stillbirth. Avoid all smoking, herbs, alcohol, and unprescribed drugs. Chemicals in these products can affect the formation and growth of the baby. Do not use any products that contain nicotine or tobacco, such as cigarettes and e-cigarettes. If you need help quitting, ask your health care provider. Visit your dentist if you have not gone yet during your pregnancy. Use a soft toothbrush to brush your teeth and be gentle when you floss. Contact a health care provider if: You have dizziness. You have mild pelvic cramps, pelvic pressure, or nagging pain in the abdominal area. You have persistent nausea, vomiting, or diarrhea. You have a bad smelling vaginal discharge. You have pain when you urinate. Get help right away if: You have a fever. You are leaking fluid from your vagina. You have spotting or bleeding from your vagina. You have severe abdominal cramping or pain. You have rapid weight gain or weight loss. You have shortness of breath with chest pain. You notice sudden or extreme swelling of your face, hands, ankles, feet, or legs. You  have not felt your baby move in over an hour. You have severe headaches that do not go away when you take medicine. You have vision changes. Summary The second trimester is from week 14 through week 27 (months 4 through 6). It is also a time when the fetus is growing rapidly. Your body goes through many changes during pregnancy. The changes vary from woman to woman. Avoid all smoking, herbs, alcohol, and unprescribed drugs. These chemicals affect the formation and growth your baby. Do not use any tobacco products, such as cigarettes, chewing tobacco, and e-cigarettes. If you need help quitting, ask your health care provider. Contact your health care provider if you have any questions. Keep all prenatal visits as told by your health care provider. This is important. This information is not intended to replace advice given to you by your health care provider. Make sure you discuss any questions you have with your health care provider. Document Released: 05/03/2001 Document Revised: 10/15/2015 Document Reviewed: 07/10/2012 Elsevier Interactive Patient Education  2017 [employer]  

## 2021-12-01 LAB — COMPREHENSIVE METABOLIC PANEL
ALT: 10 IU/L (ref 0–32)
AST: 16 IU/L (ref 0–40)
Albumin/Globulin Ratio: 1.6 (ref 1.2–2.2)
Albumin: 4.1 g/dL (ref 3.9–4.9)
Alkaline Phosphatase: 67 IU/L (ref 44–121)
BUN/Creatinine Ratio: 11 (ref 9–23)
BUN: 7 mg/dL (ref 6–20)
Bilirubin Total: 0.2 mg/dL (ref 0.0–1.2)
CO2: 21 mmol/L (ref 20–29)
Calcium: 9.5 mg/dL (ref 8.7–10.2)
Chloride: 103 mmol/L (ref 96–106)
Creatinine, Ser: 0.64 mg/dL (ref 0.57–1.00)
Globulin, Total: 2.6 g/dL (ref 1.5–4.5)
Glucose: 91 mg/dL (ref 70–99)
Potassium: 3.8 mmol/L (ref 3.5–5.2)
Sodium: 137 mmol/L (ref 134–144)
Total Protein: 6.7 g/dL (ref 6.0–8.5)
eGFR: 120 mL/min/{1.73_m2} (ref >59)

## 2021-12-01 LAB — CBC/D/PLT+RPR+RH+ABO+RUBIGG...
Antibody Screen: NEGATIVE
Basophils Absolute: 0.1 10*3/uL (ref 0.0–0.2)
Basos: 0 %
EOS (ABSOLUTE): 0.1 10*3/uL (ref 0.0–0.4)
Eos: 1 %
HCV Ab: NONREACTIVE
HIV Screen 4th Generation wRfx: NONREACTIVE
Hematocrit: 36.2 % (ref 34.0–46.6)
Hemoglobin: 12.1 g/dL (ref 11.1–15.9)
Hepatitis B Surface Ag: NEGATIVE
Immature Grans (Abs): 0.1 10*3/uL (ref 0.0–0.1)
Immature Granulocytes: 1 %
Lymphocytes Absolute: 2.2 10*3/uL (ref 0.7–3.1)
Lymphs: 20 %
MCH: 29.7 pg (ref 26.6–33.0)
MCHC: 33.4 g/dL (ref 31.5–35.7)
MCV: 89 fL (ref 79–97)
Monocytes Absolute: 0.6 10*3/uL (ref 0.1–0.9)
Monocytes: 5 %
Neutrophils Absolute: 8.3 10*3/uL — ABNORMAL HIGH (ref 1.4–7.0)
Neutrophils: 73 %
Platelets: 397 10*3/uL (ref 150–450)
RBC: 4.07 x10E6/uL (ref 3.77–5.28)
RDW: 12.1 % (ref 11.7–15.4)
RPR Ser Ql: NONREACTIVE
Rh Factor: POSITIVE
Rubella Antibodies, IGG: 1.83 index (ref >0.99)
WBC: 11.3 10*3/uL — ABNORMAL HIGH (ref 3.4–10.8)

## 2021-12-01 LAB — GC/CHLAMYDIA PROBE AMP
Chlamydia trachomatis, NAA: NEGATIVE
Neisseria Gonorrhoeae by PCR: NEGATIVE

## 2021-12-01 LAB — PROTEIN / CREATININE RATIO, URINE
Creatinine, Urine: 220.7 mg/dL
Protein, Ur: 17.7 mg/dL
Protein/Creat Ratio: 80 mg/g creat (ref 0–200)

## 2021-12-01 LAB — HGB FRACTIONATION CASCADE
Hgb A2: 2.9 % (ref 1.8–3.2)
Hgb A: 96.8 % (ref 96.4–98.8)
Hgb F: 0.3 % (ref 0.0–2.0)
Hgb S: 0 %

## 2021-12-01 LAB — HEMOGLOBIN A1C
Est. average glucose Bld gHb Est-mCnc: 97 mg/dL
Hgb A1c MFr Bld: 5 % (ref 4.8–5.6)

## 2021-12-01 LAB — HCV INTERPRETATION

## 2021-12-02 LAB — URINE CULTURE

## 2021-12-06 ENCOUNTER — Encounter: Payer: Self-pay | Admitting: Women's Health

## 2021-12-06 ENCOUNTER — Encounter: Payer: Self-pay | Admitting: Obstetrics & Gynecology

## 2021-12-06 LAB — PANORAMA PRENATAL TEST FULL PANEL:PANORAMA TEST PLUS 5 ADDITIONAL MICRODELETIONS: FETAL FRACTION: 4.8

## 2021-12-21 ENCOUNTER — Encounter: Payer: Self-pay | Admitting: *Deleted

## 2021-12-27 ENCOUNTER — Ambulatory Visit (INDEPENDENT_AMBULATORY_CARE_PROVIDER_SITE_OTHER): Payer: Medicaid Other

## 2021-12-27 ENCOUNTER — Ambulatory Visit (INDEPENDENT_AMBULATORY_CARE_PROVIDER_SITE_OTHER): Payer: Medicaid Other | Admitting: Obstetrics & Gynecology

## 2021-12-27 ENCOUNTER — Encounter: Payer: Self-pay | Admitting: Obstetrics & Gynecology

## 2021-12-27 VITALS — BP 136/91 | HR 98 | Wt 225.4 lb

## 2021-12-27 DIAGNOSIS — O0992 Supervision of high risk pregnancy, unspecified, second trimester: Secondary | ICD-10-CM

## 2021-12-27 DIAGNOSIS — Z3A18 18 weeks gestation of pregnancy: Secondary | ICD-10-CM

## 2021-12-27 DIAGNOSIS — Z363 Encounter for antenatal screening for malformations: Secondary | ICD-10-CM

## 2021-12-27 DIAGNOSIS — O10919 Unspecified pre-existing hypertension complicating pregnancy, unspecified trimester: Secondary | ICD-10-CM

## 2021-12-27 LAB — POCT URINALYSIS DIPSTICK OB
Glucose, UA: NEGATIVE
Leukocytes, UA: NEGATIVE
Nitrite, UA: NEGATIVE
POC,PROTEIN,UA: NEGATIVE

## 2021-12-27 MED ORDER — LABETALOL HCL 100 MG PO TABS: 100.0000 mg | ORAL_TABLET | Freq: Two times a day (BID) | ORAL | 6 refills | Status: DC

## 2021-12-27 NOTE — Progress Notes (Signed)
[place] 18+6 wks,breech,anterior placenta gr 0,normal ovaries,cx 3.7 cm,SVP of fluid 4.1 cm,FHR 148 bpm,4 LVEICF,EFW 258 g 41%,anatomy complete

## 2021-12-27 NOTE — Progress Notes (Signed)
HIGH-RISK PREGNANCY VISIT Patient name: Laura Andersen MRN [id number]  Date of birth: Oct 11, 1990 Chief Complaint:   High Risk Gestation ([place] today; having lots of swelling in hands and ankles)  History of Present Illness:   Laura Andersen is a 31 y.o. [phone] female at [redacted]w[redacted]d with an Estimated Date of Delivery: 05/24/22 being seen today for ongoing management of a high-risk pregnancy complicated by: Chronic HTN0 no meds.    Today she reports  swelling .   Notes mostly pitting edema in feet/legs occasionally in her hands.  Denies HA or blurry vision.  Pt checking home BPs, mostly130/90s.  Contractions: Not present. Vag. Bleeding: None.  Movement: Present. denies leaking of fluid.      11/29/2021    9:54 AM 09/09/2021    3:07 PM 01/28/2021    1:31 PM 05/05/2020    4:10 PM 04/08/2020    8:54 AM  Depression screen PHQ 2/9  Decreased Interest 2 0 0 0 0  Down, Depressed, Hopeless 0 0 0 0 0  PHQ - 2 Score 2 0 0 0 0  Altered sleeping 2      Tired, decreased energy 2      Change in appetite 0      Feeling bad or failure about yourself  0      Trouble concentrating 0      Moving slowly or fidgety/restless 0      Suicidal thoughts 0      PHQ-9 Score 6         Current Outpatient Medications  Medication Instructions   aspirin EC 162 mg, Oral, Daily, Swallow whole.   loratadine (CLARITIN) 10 mg, Oral, Daily PRN   Prenatal Vit-Fe Fumarate-FA (PRENATAL VITAMIN PO) Oral   valACYclovir (VALTREX) 1,000 mg, Oral, Daily     Review of Systems:   Pertinent items are noted in HPI Denies abnormal vaginal discharge w/ itching/odor/irritation, headaches, visual changes, shortness of breath, chest pain, abdominal pain, severe nausea/vomiting, or problems with urination or bowel movements unless otherwise stated above. Pertinent History Reviewed:  Reviewed past medical,surgical, social, obstetrical and family history.  Reviewed problem list, medications and allergies. Physical Assessment:    Vitals:   12/27/21 1155  BP: (!) 136/91  Pulse: 98  Weight: 225 lb 6.4 oz (102.2 kg)  Body mass index is 36.38 kg/m.           Physical Examination:   General appearance: alert, well appearing, and in no distress  Mental status: normal mood, behavior, speech, dress, motor activity, and thought processes  Skin: warm & dry   Extremities: Edema: Trace , no calf tenderness bilaterally   Cardiovascular: normal heart rate noted  Respiratory: normal respiratory effort, no distress  Abdomen: gravid, soft, non-tender  Pelvic: Cervical exam deferred         Fetal Status:     Movement: Present    Fetal Surveillance Testing today: anatomy scan -breech,anterior placenta gr 0,normal ovaries,cx 3.7 cm,SVP of fluid 4.1 cm,FHR 148 bpm,4 LVEICF,EFW 258 g 41%,anatomy complete  Chaperone: N/A    Results for orders placed or performed in visit on 12/27/21 (from the past 24 hour(s))  POC Urinalysis Dipstick OB   Collection Time: 12/27/21 11:58 AM  Result Value Ref Range   Color, UA     Clarity, UA     Glucose, UA Negative Negative   Bilirubin, UA     Ketones, UA 3+    Spec Grav, UA     Blood, UA trace  pH, UA     POC,PROTEIN,UA Negative Negative, Trace, Small (1+), Moderate (2+), Large (3+), 4+   Urobilinogen, UA     Nitrite, UA neg    Leukocytes, UA Negative Negative   Appearance     Odor       Assessment & Plan:  High-risk pregnancy: [id number] at [redacted]w[redacted]d with an Estimated Date of Delivery: 05/24/22   1) Chronic HTN -plan to start on Labetalol 100mg  bid -continue ASA -continue growth q 4wks  -anatomy scan today- normal Isolated LVEICF noted  Meds: No orders of the defined types were placed in this encounter.   Labs/procedures today: anatomy scan  Treatment Plan:  as outlined above  Reviewed: Preterm labor symptoms and general obstetric precautions including but not limited to vaginal bleeding, contractions, leaking of fluid and fetal movement were reviewed in detail with the  patient.  All questions were answered. Pt has home bp cuff. Check bp weekly, let know if >140/90.   Follow-up: Return in about 4 weeks (around 01/24/2022) for LROB visit.   No future appointments.  Orders Placed This Encounter  Procedures   POC Urinalysis Dipstick OB    03/26/2022, DO Attending Obstetrician & Gynecologist, [facility] for [facility]

## 2022-01-25 ENCOUNTER — Other Ambulatory Visit: Payer: Self-pay | Admitting: Obstetrics & Gynecology

## 2022-01-25 DIAGNOSIS — O10919 Unspecified pre-existing hypertension complicating pregnancy, unspecified trimester: Secondary | ICD-10-CM

## 2022-01-26 ENCOUNTER — Ambulatory Visit (INDEPENDENT_AMBULATORY_CARE_PROVIDER_SITE_OTHER): Payer: Medicaid Other | Admitting: Obstetrics & Gynecology

## 2022-01-26 ENCOUNTER — Other Ambulatory Visit: Payer: Medicaid Other

## 2022-01-26 ENCOUNTER — Encounter: Payer: Self-pay | Admitting: Obstetrics & Gynecology

## 2022-01-26 ENCOUNTER — Ambulatory Visit (INDEPENDENT_AMBULATORY_CARE_PROVIDER_SITE_OTHER): Payer: Medicaid Other

## 2022-01-26 ENCOUNTER — Encounter: Payer: Medicaid Other | Admitting: Obstetrics & Gynecology

## 2022-01-26 VITALS — BP 112/67 | HR 98 | Wt 226.0 lb

## 2022-01-26 DIAGNOSIS — O10919 Unspecified pre-existing hypertension complicating pregnancy, unspecified trimester: Secondary | ICD-10-CM

## 2022-01-26 DIAGNOSIS — Z3A23 23 weeks gestation of pregnancy: Secondary | ICD-10-CM | POA: Diagnosis not present

## 2022-01-26 DIAGNOSIS — O0992 Supervision of high risk pregnancy, unspecified, second trimester: Secondary | ICD-10-CM

## 2022-01-26 MED ORDER — BLOOD PRESSURE MONITOR MISC: 0 refills | Status: DC

## 2022-01-26 NOTE — Progress Notes (Signed)
[place] 23+1 wks,cephalic,anterior placenta gr 0,normal ovaries,SVP of fluid 5.3 cm,FHR 148 bpm,multiple LVEICF, EFW 551 g 34%,CX 3.9 cm,limited head measurement because of fetal position

## 2022-01-26 NOTE — Progress Notes (Signed)
HIGH-RISK PREGNANCY VISIT Patient name: GERALINE HALBERSTADT MRN [id number]  Date of birth: [birth year] Chief Complaint:   Routine Prenatal Visit  History of Present Illness:   Laura Andersen is a 31 y.o. [id number] female at [redacted]w[redacted]d with an Estimated Date of Delivery: 05/24/22 being seen today for ongoing management of a high-risk pregnancy complicated by:  -chronic HTN- on Labetalol 100mg  bid.    Today she reports no complaints.   Contractions: Not present. Vag. Bleeding: None.  Movement: Present. denies leaking of fluid.      11/29/2021    9:54 AM 09/09/2021    3:07 PM 01/28/2021    1:31 PM 05/05/2020    4:10 PM 04/08/2020    8:54 AM  Depression screen PHQ 2/9  Decreased Interest 2 0 0 0 0  Down, Depressed, Hopeless 0 0 0 0 0  PHQ - 2 Score 2 0 0 0 0  Altered sleeping 2      Tired, decreased energy 2      Change in appetite 0      Feeling bad or failure about yourself  0      Trouble concentrating 0      Moving slowly or fidgety/restless 0      Suicidal thoughts 0      PHQ-9 Score 6         Current Outpatient Medications  Medication Instructions   aspirin EC 162 mg, Oral, Daily, Swallow whole.   labetalol (NORMODYNE) 100 mg, Oral, 2 times daily   loratadine (CLARITIN) 10 mg, Oral, Daily PRN   Prenatal Vit-Fe Fumarate-FA (PRENATAL VITAMIN PO) Oral   valACYclovir (VALTREX) 1,000 mg, Oral, Daily     Review of Systems:   Pertinent items are noted in HPI Denies abnormal vaginal discharge w/ itching/odor/irritation, headaches, visual changes, shortness of breath, chest pain, abdominal pain, severe nausea/vomiting, or problems with urination or bowel movements unless otherwise stated above. Pertinent History Reviewed:  Reviewed past medical,surgical, social, obstetrical and family history.  Reviewed problem list, medications and allergies. Physical Assessment:   Vitals:   01/26/22 1414  BP: 112/67  Pulse: 98  Weight: 226 lb (102.5 kg)  Body mass index is 36.48  kg/m.           Physical Examination:   General appearance: alert, well appearing, and in no distress  Mental status: normal mood, behavior, speech, dress, motor activity, and thought processes  Skin: warm & dry   Extremities: Edema: None    Cardiovascular: normal heart rate noted  Respiratory: normal respiratory effort, no distress  Abdomen: gravid, soft, non-tender  Pelvic: Cervical exam deferred         Fetal Status:     Movement: Present    Fetal Surveillance Testing today: growth scan - 11-12-1998 23+1 wks,cephalic,anterior placenta gr 0,normal ovaries,SVP of fluid 5.3 cm,FHR 148 bpm,multiple LVEICF, EFW 551 g 34%,CX 3.9 cm,limited head measurement because of fetal position   Chaperone: N/A    No results found for this or any previous visit (from the past 24 hour(s)).   Assessment & Plan:  High-risk pregnancy: [place] at [redacted]w[redacted]d with an Estimated Date of Delivery: 05/24/22   1) Chronic HTN -continue current medication -growth today, AGA, continue q 4wks -antepartum testing @ 32wks  - HSV Not discussed during today's visit Meds: No orders of the defined types were placed in this encounter.   Labs/procedures today: growth scan  Treatment Plan:  as outlined above  Reviewed: Preterm labor symptoms and general obstetric precautions  including but not limited to vaginal bleeding, contractions, leaking of fluid and fetal movement were reviewed in detail with the patient.  All questions were answered. Pt has home bp cuff though it recently broke, in the process of getting another.  Will also send in Rx Check bp weekly, let us know if >140/90.   Follow-up: Return in about 4 weeks (around 02/23/2022) for HROB visit, PN2 and growth scan then growth every 4wks.   Future Appointments  Date Time Provider Department Center  02/23/2022  8:50 AM [facility]  02/23/2022  9:15 AM [facility] [place] [facility] None  02/23/2022 10:30 AM Myna Hidalgo, DO [facility]    No orders  of the defined types were placed in this encounter.   Myna Hidalgo, DO Attending Obstetrician & Gynecologist, [facility] for [employer], [facility]

## 2022-02-22 ENCOUNTER — Other Ambulatory Visit: Payer: Self-pay | Admitting: Obstetrics & Gynecology

## 2022-02-22 DIAGNOSIS — O10919 Unspecified pre-existing hypertension complicating pregnancy, unspecified trimester: Secondary | ICD-10-CM

## 2022-02-23 ENCOUNTER — Ambulatory Visit (INDEPENDENT_AMBULATORY_CARE_PROVIDER_SITE_OTHER): Payer: Medicaid Other

## 2022-02-23 ENCOUNTER — Encounter: Payer: Self-pay | Admitting: Obstetrics & Gynecology

## 2022-02-23 ENCOUNTER — Other Ambulatory Visit: Payer: Medicaid Other

## 2022-02-23 ENCOUNTER — Ambulatory Visit (INDEPENDENT_AMBULATORY_CARE_PROVIDER_SITE_OTHER): Payer: Medicaid Other | Admitting: Obstetrics & Gynecology

## 2022-02-23 ENCOUNTER — Other Ambulatory Visit (HOSPITAL_COMMUNITY)
Admission: RE | Admit: 2022-02-23 | Discharge: 2022-02-23 | Disposition: A | Discharge status: Home / Self Care | Payer: Medicaid Other | Source: Ambulatory Visit | Attending: Obstetrics & Gynecology | Admitting: Obstetrics & Gynecology

## 2022-02-23 VITALS — BP 124/77 | HR 108 | Wt 227.2 lb

## 2022-02-23 DIAGNOSIS — Z3A27 27 weeks gestation of pregnancy: Secondary | ICD-10-CM | POA: Diagnosis not present

## 2022-02-23 DIAGNOSIS — N898 Other specified noninflammatory disorders of vagina: Secondary | ICD-10-CM

## 2022-02-23 DIAGNOSIS — O0993 Supervision of high risk pregnancy, unspecified, third trimester: Secondary | ICD-10-CM

## 2022-02-23 DIAGNOSIS — O10919 Unspecified pre-existing hypertension complicating pregnancy, unspecified trimester: Secondary | ICD-10-CM

## 2022-02-23 DIAGNOSIS — O0992 Supervision of high risk pregnancy, unspecified, second trimester: Secondary | ICD-10-CM

## 2022-02-23 DIAGNOSIS — Z131 Encounter for screening for diabetes mellitus: Secondary | ICD-10-CM

## 2022-02-23 LAB — POCT URINALYSIS DIPSTICK OB
Bilirubin, UA: NEGATIVE
Blood, UA: NEGATIVE
Glucose, UA: NEGATIVE
Ketones, UA: NEGATIVE
Leukocytes, UA: NEGATIVE
Nitrite, UA: NEGATIVE
POC,PROTEIN,UA: NEGATIVE
Spec Grav, UA: 1.025 (ref 1.010–1.025)
Urobilinogen, UA: 0.2 E.U./dL
pH, UA: 6.5 (ref 5.0–8.0)

## 2022-02-23 NOTE — Progress Notes (Signed)
HIGH-RISK PREGNANCY VISIT Patient name: Laura Andersen MRN [id number]  Date of birth: 03-08-1991 Chief Complaint:   Routine Prenatal Visit  History of Present Illness:   Laura Andersen is a 31 y.o. [id number] female at [redacted]w[redacted]d with an Estimated Date of Delivery: 05/24/22 being seen today for ongoing management of a high-risk pregnancy complicated by:  -Chronic HTN -HSV.    Today she reports no complaints.   Contractions: Not present. Vag. Bleeding: None.  Movement: Present. denies leaking of fluid.      02/23/2022    9:24 AM 11/29/2021    9:54 AM 09/09/2021    3:07 PM 01/28/2021    1:31 PM 05/05/2020    4:10 PM  Depression screen PHQ 2/9  Decreased Interest 2 2 0 0 0  Down, Depressed, Hopeless 0 0 0 0 0  PHQ - 2 Score 2 2 0 0 0  Altered sleeping 2 2     Tired, decreased energy 2 2     Change in appetite 0 0     Feeling bad or failure about yourself  0 0     Trouble concentrating 0 0     Moving slowly or fidgety/restless 0 0     Suicidal thoughts 0 0     PHQ-9 Score 6 6        Current Outpatient Medications  Medication Instructions   aspirin EC 162 mg, Oral, Daily, Swallow whole.   Blood Pressure Monitor MISC For regular home bp monitoring during pregnancy   labetalol (NORMODYNE) 100 mg, Oral, 2 times daily   loratadine (CLARITIN) 10 mg, Oral, Daily PRN   Prenatal Vit-Fe Fumarate-FA (PRENATAL VITAMIN PO) Oral   valACYclovir (VALTREX) 1,000 mg, Oral, Daily     Review of Systems:   Pertinent items are noted in HPI Denies headaches, visual changes, shortness of breath, chest pain, abdominal pain, severe nausea/vomiting, or problems with urination or bowel movements unless otherwise stated above. Pertinent History Reviewed:  Reviewed past medical,surgical, social, obstetrical and family history.  Reviewed problem list, medications and allergies. Physical Assessment:   Vitals:   02/23/22 0952  BP: 124/77  Pulse: (!) 108  Weight: 227 lb 3.2 oz (103.1 kg)  Body  mass index is 36.67 kg/m.           Physical Examination:   General appearance: alert, well appearing, and in no distress  Mental status: normal mood, behavior, speech, dress, motor activity, and thought processes  Skin: warm & dry   Extremities: Edema: None    Cardiovascular: normal heart rate noted  Respiratory: normal respiratory effort, no distress  Abdomen: gravid, soft, non-tender  Pelvic: Cervical exam deferred         Fetal Status:     Movement: Present    Fetal Surveillance Testing today: [place] 37+6 wks,cephalic,cx 3.1 cm,anterior placenta gr 0,AFI 16.2 cm,normal ovaries,FHR 137 bpm,LVEICF,EFW 1093 g 54%   Chaperone: N/A    No results found for this or any previous visit (from the past 24 hour(s)).   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   Labs/procedures today: growth scan  Treatment Plan:  as outlined above  Reviewed: Preterm labor symptoms  and general obstetric precautions including but not limited to vaginal bleeding, contractions, leaking of fluid and fetal movement were reviewed in detail with the patient.  All questions were answered. Pt has home bp cuff. Check bp weekly, let us know if >140/90.   Follow-up: Return in about 4 weeks (around 03/23/2022) for HROB visit and growth and in 4 weeks- start NST/BPP twice weekly.   Future Appointments  Date Time Provider Department Center  02/23/2022 10:30 AM Myna Hidalgo, DO [facility]  03/22/2022  8:30 AM [facility] [place] [facility] None  03/22/2022  9:30 AM Lazaro Arms, MD [facility]  03/29/2022  9:15 AM [facility] [place] [facility] None  03/29/2022 10:30 AM  Lazaro Arms, MD [facility]  04/01/2022 10:10 AM [facility] NURSE [facility]  04/05/2022  9:15 AM [facility] [place] [facility] None  04/05/2022 10:30 AM Lazaro Arms, MD [facility]  04/08/2022  9:30 AM [facility] NURSE [facility]  04/12/2022  8:30 AM [facility] [place] [facility] None  04/12/2022  9:30 AM Myna Hidalgo, DO [facility]  04/15/2022  9:30 AM [facility] NURSE [facility]  04/19/2022  8:30 AM [facility] [place] [facility] None  04/19/2022  9:30 AM Lazaro Arms, MD [facility]  04/22/2022  9:30 AM [facility] NURSE [facility]  04/26/2022  8:30 AM [facility] [place] [facility] None  04/26/2022  9:30 AM Lazaro Arms, MD [facility]  04/29/2022  9:30 AM [facility] NURSE [facility]  05/03/2022  8:30 AM [facility] [place] [facility] None  05/03/2022  9:30 AM Lazaro Arms, MD [facility]  05/06/2022  9:30 AM [facility] NURSE [facility]  05/10/2022  8:30 AM [facility] [place] [facility] None  05/10/2022  9:30 AM Lazaro Arms, MD [facility]  05/13/2022  9:30 AM [facility] NURSE [facility]  05/17/2022  8:30 AM [facility] [place] [facility] None  05/17/2022  9:30 AM Lazaro Arms, MD [facility]  05/20/2022  9:30 AM [facility] NURSE [facility]    Orders Placed This Encounter  Procedures   POC Urinalysis Dipstick OB    Myna Hidalgo, DO Attending Obstetrician & Gynecologist, [facility] [employer], [facility]

## 2022-02-23 NOTE — Progress Notes (Signed)
[place] 32+6 wks,cephalic,cx 3.1 cm,anterior placenta gr 0,AFI 16.2 cm,normal ovaries,FHR 137 bpm,LVEICF,EFW 1093 g 54%

## 2022-02-24 LAB — HIV ANTIBODY (ROUTINE TESTING W REFLEX): HIV Screen 4th Generation wRfx: NONREACTIVE

## 2022-02-24 LAB — CBC
Hematocrit: 35.6 % (ref 34.0–46.6)
Hemoglobin: 11.8 g/dL (ref 11.1–15.9)
MCH: 29.7 pg (ref 26.6–33.0)
MCHC: 33.1 g/dL (ref 31.5–35.7)
MCV: 90 fL (ref 79–97)
Platelets: 353 10*3/uL (ref 150–450)
RBC: 3.97 x10E6/uL (ref 3.77–5.28)
RDW: 12.5 % (ref 11.7–15.4)
WBC: 12.3 10*3/uL — ABNORMAL HIGH (ref 3.4–10.8)

## 2022-02-24 LAB — GLUCOSE TOLERANCE, 2 HOURS W/ 1HR
Glucose, 1 hour: 141 mg/dL (ref 70–179)
Glucose, 2 hour: 118 mg/dL (ref 70–152)
Glucose, Fasting: 76 mg/dL (ref 70–91)

## 2022-02-24 LAB — CERVICOVAGINAL ANCILLARY ONLY
Bacterial Vaginitis (gardnerella): NEGATIVE
Candida Glabrata: NEGATIVE
Candida Vaginitis: NEGATIVE
Comment: NEGATIVE
Comment: NEGATIVE
Comment: NEGATIVE

## 2022-02-24 LAB — RPR: RPR Ser Ql: NONREACTIVE

## 2022-02-24 LAB — ANTIBODY SCREEN: Antibody Screen: NEGATIVE

## 2022-03-21 ENCOUNTER — Other Ambulatory Visit: Payer: Self-pay | Admitting: Obstetrics & Gynecology

## 2022-03-21 DIAGNOSIS — O10919 Unspecified pre-existing hypertension complicating pregnancy, unspecified trimester: Secondary | ICD-10-CM

## 2022-03-22 ENCOUNTER — Ambulatory Visit (INDEPENDENT_AMBULATORY_CARE_PROVIDER_SITE_OTHER): Payer: Medicaid Other

## 2022-03-22 ENCOUNTER — Encounter: Payer: Self-pay | Admitting: Obstetrics & Gynecology

## 2022-03-22 ENCOUNTER — Ambulatory Visit (INDEPENDENT_AMBULATORY_CARE_PROVIDER_SITE_OTHER): Payer: Medicaid Other | Admitting: Obstetrics & Gynecology

## 2022-03-22 VITALS — BP 134/84 | HR 101 | Wt 230.0 lb

## 2022-03-22 DIAGNOSIS — O0993 Supervision of high risk pregnancy, unspecified, third trimester: Secondary | ICD-10-CM

## 2022-03-22 DIAGNOSIS — O10919 Unspecified pre-existing hypertension complicating pregnancy, unspecified trimester: Secondary | ICD-10-CM

## 2022-03-22 DIAGNOSIS — Z3A31 31 weeks gestation of pregnancy: Secondary | ICD-10-CM

## 2022-03-22 DIAGNOSIS — O0990 Supervision of high risk pregnancy, unspecified, unspecified trimester: Secondary | ICD-10-CM

## 2022-03-22 DIAGNOSIS — Q625 Duplication of ureter: Secondary | ICD-10-CM

## 2022-03-22 LAB — POCT URINALYSIS DIPSTICK OB
Blood, UA: NEGATIVE
Glucose, UA: NEGATIVE
Ketones, UA: NEGATIVE
Leukocytes, UA: NEGATIVE
Nitrite, UA: NEGATIVE
POC,PROTEIN,UA: NEGATIVE

## 2022-03-22 NOTE — Progress Notes (Signed)
[place] 31 wks,cephalic,anterior placenta gr 0,AFI 11.4 cm,FHR 146 bpm,LVEICF,left kidney is larger than right,? duplicated left renal pelvis,LK 5.7 cm,RK 3.9 cm,EFW 1727 g 46%

## 2022-03-22 NOTE — Progress Notes (Signed)
HIGH-RISK PREGNANCY VISIT Patient name: Laura Andersen MRN [id number]  Date of birth: 1990-07-29 Chief Complaint:   Routine Prenatal Visit  History of Present Illness:   Laura Andersen is a 31 y.o. [id number] female at [redacted]w[redacted]d with an Estimated Date of Delivery: 05/24/22 being seen today for ongoing management of a high-risk pregnancy complicated by chronic hypertension currently on labetalol 100 BID + 81 ASA.    Today she reports no complaints. Contractions: Not present. Vag. Bleeding: None.  Movement: Present. denies leaking of fluid.      02/23/2022    9:24 AM 11/29/2021    9:54 AM 09/09/2021    3:07 PM 01/28/2021    1:31 PM 05/05/2020    4:10 PM  Depression screen PHQ 2/9  Decreased Interest 2 2 0 0 0  Down, Depressed, Hopeless 0 0 0 0 0  PHQ - 2 Score 2 2 0 0 0  Altered sleeping 2 2     Tired, decreased energy 2 2     Change in appetite 0 0     Feeling bad or failure about yourself  0 0     Trouble concentrating 0 0     Moving slowly or fidgety/restless 0 0     Suicidal thoughts 0 0     PHQ-9 Score 6 6           02/23/2022    9:24 AM 11/29/2021    9:54 AM 02/19/2020    1:40 PM  GAD 7 : Generalized Anxiety Score  Nervous, Anxious, on Edge 0 0 0  Control/stop worrying 0 0 0  Worry too much - different things 0 0 0  Trouble relaxing 2 1 0  Restless 0 0 0  Easily annoyed or irritable 2 1 0  Afraid - awful might happen 0 0 0  Total GAD 7 Score 4 2 0  Anxiety Difficulty   Not difficult at all     Review of Systems:   Pertinent items are noted in HPI Denies abnormal vaginal discharge w/ itching/odor/irritation, headaches, visual changes, shortness of breath, chest pain, abdominal pain, severe nausea/vomiting, or problems with urination or bowel movements unless otherwise stated above. Pertinent History Reviewed:  Reviewed past medical,surgical, social, obstetrical and family history.  Reviewed problem list, medications and allergies. Physical Assessment:    Vitals:   03/22/22 0904  BP: 134/84  Pulse: (!) 101  Weight: 230 lb (104.3 kg)  Body mass index is 37.12 kg/m.           Physical Examination:   General appearance: alert, well appearing, and in no distress  Mental status: alert, oriented to person, place, and time  Skin: warm & dry   Extremities: Edema: None    Cardiovascular: normal heart rate noted  Respiratory: normal respiratory effort, no distress  Abdomen: gravid, soft, non-tender  Pelvic: Cervical exam deferred         Fetal Status:     Movement: Present    Fetal Surveillance Testing today: NST   Chaperone: Celene Squibb    Results for orders placed or performed in visit on 03/22/22 (from the past 24 hour(s))  POC Urinalysis Dipstick OB   Collection Time: 03/22/22  9:10 AM  Result Value Ref Range   Color, UA     Clarity, UA     Glucose, UA Negative Negative   Bilirubin, UA     Ketones, UA negative    Spec Grav, UA     Blood, UA  negative    pH, UA     POC,PROTEIN,UA Negative Negative, Trace, Small (1+), Moderate (2+), Large (3+), 4+   Urobilinogen, UA     Nitrite, UA negative    Leukocytes, UA Negative Negative   Appearance     Odor      Assessment & Plan:  High-risk pregnancy: [id number] at [redacted]w[redacted]d with an Estimated Date of Delivery: 05/24/22      ICD-10-CM   1. Supervision of high risk pregnancy, antepartum  O09.90 POC Urinalysis Dipstick OB    2. 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 [redacted] weeks gestation of pregnancy  Z3A.31 POC Urinalysis Dipstick OB       Meds: No orders of the defined types were placed in this encounter.   Orders:  Orders Placed This Encounter  Procedures   POC Urinalysis Dipstick OB     Labs/procedures today: sonogram  Treatment Plan:  twice weekly surveillance IOL 39 weeks or as clinically indicated   Follow-up:  Return for keep scheduled.   Future Appointments  Date Time Provider [place]  03/29/2022 10:10 AM [facility] NURSE [facility]  03/29/2022 10:30 AM Florian Buff, MD [facility]  04/01/2022 10:10 AM [facility] NURSE [facility]  04/05/2022  9:15 AM [facility] [place] [facility] None  04/05/2022 10:30 AM Florian Buff, MD [facility]  04/08/2022  9:30 AM [facility] NURSE [facility]  04/12/2022  8:30 AM [place] - [facility] [place] [facility] None  04/12/2022  9:30 AM Janyth Pupa, DO [facility]  04/19/2022  8:30 AM [facility] [place] [facility] None  04/19/2022  9:30 AM Florian Buff, MD [facility]  04/22/2022  9:30 AM [facility] NURSE [facility]  04/26/2022  8:30 AM [place] - [facility] [place] [facility] None  04/26/2022  9:30 AM Florian Buff, MD [facility]  04/29/2022  9:30 AM [facility] NURSE [facility]  05/03/2022  8:30 AM [place] - [facility] [place] [facility] None  05/03/2022  9:50 AM Florian Buff, MD [facility]  05/06/2022  9:30 AM [facility] NURSE [facility]  05/10/2022  8:30 AM [place] - [facility] [place] [facility] None  05/10/2022 10:30 AM Florian Buff, MD [facility]  05/13/2022  9:30 AM [facility] NURSE [facility]  05/17/2022  8:30 AM [place] - [facility] [place] [facility] None  05/17/2022  9:30 AM Florian Buff, MD [facility]  05/20/2022  9:30 AM [facility] NURSE [facility]    Orders Placed This Encounter  Procedures   POC Urinalysis Dipstick OB   Florian Buff  Attending Physician for the [facility] 03/22/2022 10:11 AM

## 2022-03-24 ENCOUNTER — Other Ambulatory Visit: Payer: Medicaid Other

## 2022-03-24 ENCOUNTER — Encounter: Payer: Medicaid Other | Admitting: Obstetrics & Gynecology

## 2022-03-29 ENCOUNTER — Other Ambulatory Visit: Payer: Medicaid Other

## 2022-03-29 ENCOUNTER — Ambulatory Visit (INDEPENDENT_AMBULATORY_CARE_PROVIDER_SITE_OTHER): Payer: Medicaid Other | Admitting: Obstetrics & Gynecology

## 2022-03-29 VITALS — BP 127/78 | HR 100 | Wt 229.0 lb

## 2022-03-29 DIAGNOSIS — O0993 Supervision of high risk pregnancy, unspecified, third trimester: Secondary | ICD-10-CM | POA: Diagnosis not present

## 2022-03-29 DIAGNOSIS — Z23 Encounter for immunization: Secondary | ICD-10-CM

## 2022-03-29 DIAGNOSIS — O10913 Unspecified pre-existing hypertension complicating pregnancy, third trimester: Secondary | ICD-10-CM

## 2022-03-29 DIAGNOSIS — Z3A32 32 weeks gestation of pregnancy: Secondary | ICD-10-CM | POA: Diagnosis not present

## 2022-03-29 DIAGNOSIS — O10919 Unspecified pre-existing hypertension complicating pregnancy, unspecified trimester: Secondary | ICD-10-CM

## 2022-03-29 DIAGNOSIS — Q625 Duplication of ureter: Secondary | ICD-10-CM

## 2022-03-29 LAB — POCT URINALYSIS DIPSTICK OB
Blood, UA: NEGATIVE
Glucose, UA: NEGATIVE
Ketones, UA: NEGATIVE
Leukocytes, UA: NEGATIVE
Nitrite, UA: NEGATIVE

## 2022-03-29 NOTE — Progress Notes (Signed)
HIGH-RISK PREGNANCY VISIT Patient name: Laura Andersen MRN [id number]  Date of birth: 1990-12-10 Chief Complaint:   Routine Prenatal Visit  History of Present Illness:   Laura Andersen is a 31 y.o. [id number] female at [redacted]w[redacted]d with an Estimated Date of Delivery: 05/24/22 being seen today for ongoing management of a high-risk pregnancy complicated by chronic hypertension currently on labetalol 100 BID.    Today she reports no complaints. Contractions: Irritability. Vag. Bleeding: None.  Movement: Present. denies leaking of fluid.      02/23/2022    9:24 AM 11/29/2021    9:54 AM 09/09/2021    3:07 PM 01/28/2021    1:31 PM 05/05/2020    4:10 PM  Depression screen PHQ 2/9  Decreased Interest 2 2 0 0 0  Down, Depressed, Hopeless 0 0 0 0 0  PHQ - 2 Score 2 2 0 0 0  Altered sleeping 2 2     Tired, decreased energy 2 2     Change in appetite 0 0     Feeling bad or failure about yourself  0 0     Trouble concentrating 0 0     Moving slowly or fidgety/restless 0 0     Suicidal thoughts 0 0     PHQ-9 Score 6 6           02/23/2022    9:24 AM 11/29/2021    9:54 AM 02/19/2020    1:40 PM  GAD 7 : Generalized Anxiety Score  Nervous, Anxious, on Edge 0 0 0  Control/stop worrying 0 0 0  Worry too much - different things 0 0 0  Trouble relaxing 2 1 0  Restless 0 0 0  Easily annoyed or irritable 2 1 0  Afraid - awful might happen 0 0 0  Total GAD 7 Score 4 2 0  Anxiety Difficulty   Not difficult at all     Review of Systems:   Pertinent items are noted in HPI Denies abnormal vaginal discharge w/ itching/odor/irritation, headaches, visual changes, shortness of breath, chest pain, abdominal pain, severe nausea/vomiting, or problems with urination or bowel movements unless otherwise stated above. Pertinent History Reviewed:  Reviewed past medical,surgical, social, obstetrical and family history.  Reviewed problem list, medications and allergies. Physical Assessment:   Vitals:    03/29/22 1017  BP: 127/78  Pulse: 100  Weight: 229 lb (103.9 kg)  Body mass index is 36.96 kg/m.           Physical Examination:   General appearance: alert, well appearing, and in no distress  Mental status: alert, oriented to person, place, and time  Skin: warm & dry   Extremities: Edema: Trace    Cardiovascular: normal heart rate noted  Respiratory: normal respiratory effort, no distress  Abdomen: gravid, soft, non-tender  Pelvic: Cervical exam deferred         Fetal Status:     Movement: Present    Fetal Surveillance Testing today: Laura Andersen is at [redacted]w[redacted]d Estimated Date of Delivery: 05/24/22  NST being performed due to [facility]  Today the NST is Reactive  Fetal Monitoring:  Baseline: 140 bpm, Variability: Good {> 6 bpm), Accelerations: Reactive, and Decelerations: Absent   reactive  The accelerations are >15 bpm and more than 2 in 20 minutes  Final diagnosis:  Reactive NST  Lazaro Arms, MD     Chaperone: N/A    Results for orders placed or performed in visit on 03/29/22 (from  the past 24 hour(s))  POC Urinalysis Dipstick OB   Collection Time: 03/29/22 10:33 AM  Result Value Ref Range   Color, UA     Clarity, UA     Glucose, UA Negative Negative   Bilirubin, UA     Ketones, UA negative    Spec Grav, UA     Blood, UA negative    pH, UA     POC,PROTEIN,UA Small (1+) Negative, Trace, Small (1+), Moderate (2+), Large (3+), 4+   Urobilinogen, UA     Nitrite, UA negative    Leukocytes, UA Negative Negative   Appearance     Odor      Assessment & Plan:  High-risk pregnancy: [id number] at [redacted]w[redacted]d with an Estimated Date of Delivery: 05/24/22      ICD-10-CM   1. Supervision of high risk pregnancy in third trimester  O09.93 POC Urinalysis Dipstick OB    Tdap vaccine greater than or equal to 7yo IM    2. Chronic hypertension affecting pregnancy  O10.919 POC Urinalysis Dipstick OB   labetalol 100 BID, reactive NST today    3. 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 [redacted] weeks gestation of pregnancy  Z3A.32 Tdap vaccine greater than or equal to 7yo IM    5. Need for immunization against influenza  Z23 Flu Vaccine QUAD 47mo+IM (Fluarix, Fluzone & Alfiuria Quad PF)        Meds: No orders of the defined types were placed in this encounter.   Orders:  Orders Placed This Encounter  Procedures   Tdap vaccine greater than or equal to 7yo IM   Flu Vaccine QUAD 47mo+IM (Fluarix, Fluzone & Alfiuria Quad PF)   POC Urinalysis Dipstick OB     Labs/procedures today: NST  Treatment Plan:  twice weekly surveillance, if well controlled IOL 39 weeks  Reviewed: Preterm labor symptoms and general obstetric precautions including but not limited to vaginal bleeding, contractions, leaking of fluid and fetal movement were reviewed in detail with the patient.  All questions were answered. Does have home bp cuff. Office bp cuff given: not applicable. Check bp daily, let us know if consistently >150 and/or >95.  Follow-up: Return for keep scheduled.   Future Appointments  Date Time Provider [place]  03/31/2022  2:00 PM Lindell Spar, MD [facility]  04/01/2022 10:10 AM [facility] NURSE [facility]  04/05/2022  9:15 AM [facility] - [facility]YN [place] [facility]G None  04/05/2022 10:30 AM Florian Buff, MD [facility]  04/08/2022  9:30 AM [facility] NURSE [facility]  04/12/2022  8:30 AM [facility] - [facility]YN [place] [facility]G None  04/12/2022  9:30 AM Janyth Pupa, DO [facility]TO[facility]YN  04/19/2022  8:30 AM [facility] - [facility] [place] [facility] None  04/19/2022  9:30 AM Florian Buff, MD [facility]  04/22/2022  9:30 AM [facility] NURSE [facility]  04/26/2022  8:30 AM [facility] - [facility]YN [place] [facility]-FT[facility] None  04/26/2022  9:30 AM Florian Buff, MD [facility]  04/29/2022  9:30 AM [facility] NURSE [facility]  05/03/2022  8:30 AM [facility] - [facility] [place] [facility]-FT[facility] None  05/03/2022  9:50 AM Florian Buff, MD [facility]  05/06/2022  9:30 AM [facility]  NURSE [facility]  05/10/2022  8:30 AM [facility] - [facility]YN [place] [facility]-FT[facility]G None  05/10/2022 10:30 AM Florian Buff, MD [facility]  05/13/2022  9:30 AM [facility] NURSE [facility]  05/20/2022  9:30 AM [facility] NURSE [facility]  Orders Placed This Encounter  Procedures   Tdap vaccine greater than or equal to 7yo IM   Flu Vaccine QUAD 73mo+IM (Fluarix, Fluzone & Alfiuria Quad PF)   POC Urinalysis Dipstick OB   Florian Buff  Attending Physician for the [facility] 03/29/2022 11:24 AM

## 2022-03-31 ENCOUNTER — Ambulatory Visit (INDEPENDENT_AMBULATORY_CARE_PROVIDER_SITE_OTHER): Payer: Medicaid Other | Admitting: Internal Medicine

## 2022-03-31 ENCOUNTER — Telehealth: Payer: Self-pay | Admitting: Internal Medicine

## 2022-03-31 ENCOUNTER — Encounter: Payer: Self-pay | Admitting: Internal Medicine

## 2022-03-31 VITALS — BP 127/75 | HR 113 | Ht 66.0 in | Wt 227.0 lb

## 2022-03-31 DIAGNOSIS — I10 Essential (primary) hypertension: Secondary | ICD-10-CM

## 2022-03-31 DIAGNOSIS — Z0001 Encounter for general adult medical examination with abnormal findings: Secondary | ICD-10-CM | POA: Diagnosis not present

## 2022-03-31 DIAGNOSIS — Z3A32 32 weeks gestation of pregnancy: Secondary | ICD-10-CM | POA: Diagnosis not present

## 2022-03-31 NOTE — Assessment & Plan Note (Addendum)
BP Readings from Last 1 Encounters:  03/31/22 127/75   Well controlled with labetalol currently ([redacted] weeks pregnant) Counseled for compliance with the medications Advised DASH diet and moderate exercise/walking, at least 150 mins/week Advised to check BP at home and bring the log in the next visit

## 2022-03-31 NOTE — Patient Instructions (Signed)
Please continue taking medications as prescribed.  Okay to come in the next week for PPD skin test.  Please continue to follow low salt diet and ambulate as tolerated.

## 2022-03-31 NOTE — Telephone Encounter (Signed)
Physical forms  Copied Noted sleeved

## 2022-03-31 NOTE — Progress Notes (Signed)
 Established Patient Office Visit  Subjective:  Patient ID: Andersen Andersen, female    DOB: 04/18/1991  Age: 31 y.o. MRN: [id number]  CC:  Chief Complaint  Patient presents with   Annual Exam    CPE [redacted] weeks pregnant     HPI Andersen Andersen is a 31 y.o. female with past medical history of HTN who presents for annual physical.  HTN: Her BP is well controlled currently.  She is taking labetalol for hypertension as she is pregnant currently.  She has intermittent leg swelling, which improves with leg elevation.  Denies any headache, dizziness, chest pain or palpitations.  She has brought physical exam form for [employer] school.    Past Medical History:  Diagnosis Date   Furuncle of groin 02/06/2019   Herpes    Hypertension    Renal disorder    kidney stones    Past Surgical History:  Procedure Laterality Date   COSMETIC SURGERY N/A    [employer] 04/07/2020   DILATION AND CURETTAGE OF UTERUS      Family History  Problem Relation Age of Onset   Hypertension Mother    Hypertension Father    Stroke Father    Diabetes Father    Heart attack Father    Asthma Son    Cancer Maternal Grandmother    Asthma Maternal Grandfather     Social History   Socioeconomic History   Marital status: Single    Spouse name: Not on file   Number of children: Not on file   Years of education: Not on file   Highest education level: Not on file  Occupational History   Not on file  Tobacco Use   Smoking status: Former    Packs/day: 0.25    Types: Cigarettes    Quit date: 02/10/2020    Years since quitting: 2.1   Smokeless tobacco: Never  Vaping Use   Vaping Use: Never used  Substance and Sexual Activity   Alcohol use: Not Currently    Comment: occ   Drug use: No   Sexual activity: Yes    Birth control/protection: None  Other Topics Concern   Not on file  Social History Narrative   Not on file   Social Determinants of Health   Financial Resource Strain: Low Risk   (02/23/2022)   Overall Financial Resource Strain (CARDIA)    Difficulty of Paying Living Expenses: Not very hard  Food Insecurity: No Food Insecurity (02/23/2022)   Hunger Vital Sign    Worried About Running Out of Food in the Last Year: Never true    Ran Out of Food in the Last Year: Never true  Transportation Needs: No Transportation Needs (02/23/2022)   PRAPARE - Transportation    Lack of Transportation (Medical): No    Lack of Transportation (Non-Medical): No  Physical Activity: Inactive (02/23/2022)   Exercise Vital Sign    Days of Exercise per Week: 0 days    Minutes of Exercise per Session: 0 min  Stress: No Stress Concern Present (02/23/2022)   [employer] - Occupational Stress Questionnaire    Feeling of Stress : Not at all  Social Connections: Moderately Isolated (02/23/2022)   Social Connection and Isolation Panel [NHANES]    Frequency of Communication with Friends and Family: More than three times a week    Frequency of Social Gatherings with Friends and Family: Once a week    Attends Religious Services: 1 to 4 times per year      Active Member of Clubs or Organizations: No    Attends Archivist Meetings: Never    Marital Status: Never married  Intimate Partner Violence: Not At Risk (02/23/2022)   Humiliation, Afraid, Rape, and Kick questionnaire    Fear of Current or Ex-Partner: No    Emotionally Abused: No    Physically Abused: No    Sexually Abused: No    Outpatient Medications Prior to Visit  Medication Sig Dispense Refill   aspirin EC 81 MG tablet Take 2 tablets (162 mg total) by mouth daily. Swallow whole. (Patient taking differently: Take 81 mg by mouth daily. Swallow whole.) 180 tablet 2   Blood Pressure Monitor MISC For regular home bp monitoring during pregnancy 1 each 0   labetalol (NORMODYNE) 100 MG tablet Take 1 tablet (100 mg total) by mouth 2 (two) times daily. 60 tablet 6   Prenatal Vit-Fe Fumarate-FA (PRENATAL VITAMIN  PO) Take by mouth.     valACYclovir (VALTREX) 1000 MG tablet Take 1 tablet (1,000 mg total) by mouth daily. 30 tablet 12   loratadine (CLARITIN) 10 MG tablet Take 1 tablet (10 mg total) by mouth daily as needed for allergies or itching. (Patient not taking: Reported on 01/26/2022) 30 tablet 5   No facility-administered medications prior to visit.    Allergies  Allergen Reactions   Amoxicillin Itching   Benadryl [Diphenhydramine] Swelling    ROS Review of Systems  Constitutional:  Negative for chills and fever.  HENT:  Negative for congestion, sinus pressure, sinus pain and sore throat.   Eyes:  Negative for pain and discharge.  Respiratory:  Negative for cough and shortness of breath.   Cardiovascular:  Positive for leg swelling. Negative for chest pain and palpitations.  Gastrointestinal:  Negative for abdominal pain, constipation, diarrhea, nausea and vomiting.  Endocrine: Negative for polydipsia and polyuria.  Genitourinary:  Negative for dysuria and hematuria.  Musculoskeletal:  Negative for neck pain and neck stiffness.  Skin:  Negative for rash.  Neurological:  Negative for dizziness, weakness and headaches.  Psychiatric/Behavioral:  Negative for agitation and behavioral problems.       Objective:    Physical Exam Vitals reviewed.  Constitutional:      General: She is not in acute distress.    Appearance: She is not diaphoretic.  HENT:     Head: Normocephalic and atraumatic.     Nose: Nose normal.     Mouth/Throat:     Mouth: Mucous membranes are moist.  Eyes:     General: No scleral icterus.    Extraocular Movements: Extraocular movements intact.  Cardiovascular:     Rate and Rhythm: Normal rate and regular rhythm.     Pulses: Normal pulses.     Heart sounds: Normal heart sounds. No murmur heard. Pulmonary:     Breath sounds: Normal breath sounds. No wheezing or rales.  Musculoskeletal:     Cervical back: Neck supple. No tenderness.     Right lower leg: No  edema.     Left lower leg: No edema.  Skin:    General: Skin is warm.     Findings: No rash.  Neurological:     General: No focal deficit present.     Mental Status: She is alert and oriented to person, place, and time.     Sensory: No sensory deficit.     Motor: No weakness.  Psychiatric:        Mood and Affect: Mood normal.  Behavior: Behavior normal.     BP 127/75 (BP Location: Right Arm, Patient Position: Sitting, Cuff Size: Large)   Pulse (!) 113   Ht 5' 6" (1.676 m)   Wt 227 lb (103 kg)   LMP 07/17/2021 (Exact Date)   SpO2 95%   BMI 36.64 kg/m  Wt Readings from Last 3 Encounters:  03/31/22 227 lb (103 kg)  03/29/22 229 lb (103.9 kg)  03/22/22 230 lb (104.3 kg)    No results found for: "TSH" Lab Results  Component Value Date   WBC 12.3 (H) 02/23/2022   HGB 11.8 02/23/2022   HCT 35.6 02/23/2022   MCV 90 02/23/2022   PLT 353 02/23/2022   Lab Results  Component Value Date   NA 137 11/29/2021   K 3.8 11/29/2021   CO2 21 11/29/2021   GLUCOSE 91 11/29/2021   BUN 7 11/29/2021   CREATININE 0.64 11/29/2021   BILITOT 0.2 11/29/2021   ALKPHOS 67 11/29/2021   AST 16 11/29/2021   ALT 10 11/29/2021   PROT 6.7 11/29/2021   ALBUMIN 4.1 11/29/2021   CALCIUM 9.5 11/29/2021   ANIONGAP 7 03/03/2019   EGFR 120 11/29/2021   No results found for: "CHOL" No results found for: "HDL" No results found for: "LDLCALC" No results found for: "TRIG" No results found for: "CHOLHDL" Lab Results  Component Value Date   HGBA1C 5.0 11/29/2021      Assessment & Plan:   Problem List Items Addressed This Visit       Cardiovascular and Mediastinum   Essential (primary) hypertension    BP Readings from Last 1 Encounters:  03/31/22 127/75  Well controlled with labetalol currently ([redacted] weeks pregnant) Counseled for compliance with the medications Advised DASH diet and moderate exercise/walking, at least 150 mins/week Advised to check BP at home and bring the log in the  next visit        Other   Encounter for general adult medical examination with abnormal findings - Primary    Physical exam as documented. Blood tests from chart reviewed.      Other Visit Diagnoses     [redacted] weeks gestation of pregnancy          Needs PPD test for school form. Can be administered in the next week.  No orders of the defined types were placed in this encounter.   Follow-up: Return in about 6 months (around 09/29/2022) for HTN.    Rutwik K Patel, MD 

## 2022-03-31 NOTE — Assessment & Plan Note (Signed)
Physical exam as documented. Blood tests from chart reviewed.

## 2022-04-01 ENCOUNTER — Ambulatory Visit (INDEPENDENT_AMBULATORY_CARE_PROVIDER_SITE_OTHER): Payer: Medicaid Other | Admitting: *Deleted

## 2022-04-01 VITALS — BP 118/75 | HR 102 | Wt 227.2 lb

## 2022-04-01 DIAGNOSIS — O10919 Unspecified pre-existing hypertension complicating pregnancy, unspecified trimester: Secondary | ICD-10-CM

## 2022-04-01 DIAGNOSIS — O0993 Supervision of high risk pregnancy, unspecified, third trimester: Secondary | ICD-10-CM | POA: Diagnosis not present

## 2022-04-01 DIAGNOSIS — O288 Other abnormal findings on antenatal screening of mother: Secondary | ICD-10-CM

## 2022-04-01 DIAGNOSIS — Z331 Pregnant state, incidental: Secondary | ICD-10-CM

## 2022-04-01 DIAGNOSIS — Z1389 Encounter for screening for other disorder: Secondary | ICD-10-CM

## 2022-04-01 LAB — POCT URINALYSIS DIPSTICK OB
Blood, UA: NEGATIVE
Glucose, UA: NEGATIVE
Ketones, UA: NEGATIVE
POC,PROTEIN,UA: NEGATIVE

## 2022-04-01 NOTE — Progress Notes (Signed)
   NURSE VISIT- NST  SUBJECTIVE:  Laura Andersen is a 31 y.o. [phone] female at [redacted]w[redacted]d, here for a NST for pregnancy complicated by [facility].  She reports active fetal movement, contractions: none, vaginal bleeding: none, membranes: intact.   OBJECTIVE:  BP 118/75   Pulse (!) 102   Wt 227 lb 3.2 oz (103.1 kg)   LMP 07/17/2021 (Exact Date)   BMI 36.67 kg/m   Appears well, no apparent distress  Results for orders placed or performed in visit on 04/01/22 (from the past 24 hour(s))  POC Urinalysis Dipstick OB   Collection Time: 04/01/22 10:34 AM  Result Value Ref Range   Color, UA     Clarity, UA     Glucose, UA Negative Negative   Bilirubin, UA     Ketones, UA neg    Spec Grav, UA     Blood, UA neg    pH, UA     POC,PROTEIN,UA Negative Negative, Trace, Small (1+), Moderate (2+), Large (3+), 4+   Urobilinogen, UA     Nitrite, UA     Leukocytes, UA Trace (A) Negative   Appearance     Odor      NST: FHR baseline 140 bpm, Variability: moderate, Accelerations:present, Decelerations:  Absent= Cat 1/reactive Toco: none   ASSESSMENT: [id number] at [redacted]w[redacted]d with CHTN NST reactive  PLAN: EFM strip reviewed by Dr. Despina Hidden   Recommendations: keep next appointment as scheduled    Debbe Odea Demosthenes Virnig  04/01/2022 1:10 PM

## 2022-04-04 ENCOUNTER — Other Ambulatory Visit: Payer: Self-pay | Admitting: Obstetrics & Gynecology

## 2022-04-04 DIAGNOSIS — O10913 Unspecified pre-existing hypertension complicating pregnancy, third trimester: Secondary | ICD-10-CM

## 2022-04-05 ENCOUNTER — Ambulatory Visit (INDEPENDENT_AMBULATORY_CARE_PROVIDER_SITE_OTHER): Payer: Medicaid Other

## 2022-04-05 ENCOUNTER — Ambulatory Visit (INDEPENDENT_AMBULATORY_CARE_PROVIDER_SITE_OTHER): Payer: Medicaid Other | Admitting: Obstetrics & Gynecology

## 2022-04-05 ENCOUNTER — Ambulatory Visit: Payer: Medicaid Other

## 2022-04-05 VITALS — BP 128/80 | HR 92 | Wt 231.0 lb

## 2022-04-05 DIAGNOSIS — Z3A33 33 weeks gestation of pregnancy: Secondary | ICD-10-CM

## 2022-04-05 DIAGNOSIS — B009 Herpesviral infection, unspecified: Secondary | ICD-10-CM

## 2022-04-05 DIAGNOSIS — O10913 Unspecified pre-existing hypertension complicating pregnancy, third trimester: Secondary | ICD-10-CM | POA: Diagnosis not present

## 2022-04-05 DIAGNOSIS — O10919 Unspecified pre-existing hypertension complicating pregnancy, unspecified trimester: Secondary | ICD-10-CM

## 2022-04-05 DIAGNOSIS — Q625 Duplication of ureter: Secondary | ICD-10-CM

## 2022-04-05 DIAGNOSIS — O0993 Supervision of high risk pregnancy, unspecified, third trimester: Secondary | ICD-10-CM

## 2022-04-05 LAB — POCT URINALYSIS DIPSTICK OB
Blood, UA: NEGATIVE
Glucose, UA: NEGATIVE
Ketones, UA: NEGATIVE
Leukocytes, UA: NEGATIVE
Nitrite, UA: NEGATIVE
POC,PROTEIN,UA: NEGATIVE

## 2022-04-05 NOTE — Progress Notes (Signed)
[place] 33 wks,cephalic,anterior placenta gr 0,RI .52,.61,.68,.60=64%,FHR 150 bpm,AFI 11 cm,BPP 8/8,left kidney larger than right kidney ? duplicated colleting system,no renal dilatation

## 2022-04-05 NOTE — Progress Notes (Signed)
HIGH-RISK PREGNANCY VISIT Patient name: Laura Andersen MRN [id number]  Date of birth: [birth year] Chief Complaint:   Routine Prenatal Visit  History of Present Illness:   Laura Andersen is a 31 y.o. [id number] female at [redacted]w[redacted]d with an Estimated Date of Delivery: 05/24/22 being seen today for ongoing management of a high-risk pregnancy complicated by chronic hypertension currently on labetalol 100 BID.    Today she reports no complaints. Contractions: Not present. Vag. Bleeding: None.  Movement: Present. denies leaking of fluid.      03/31/2022    2:13 PM 02/23/2022    9:24 AM 11/29/2021    9:54 AM 09/09/2021    3:07 PM 01/28/2021    1:31 PM  Depression screen PHQ 2/9  Decreased Interest 0 2 2 0 0  Down, Depressed, Hopeless 0 0 0 0 0  PHQ - 2 Score 0 2 2 0 0  Altered sleeping  2 2    Tired, decreased energy  2 2    Change in appetite  0 0    Feeling bad or failure about yourself   0 0    Trouble concentrating  0 0    Moving slowly or fidgety/restless  0 0    Suicidal thoughts  0 0    PHQ-9 Score  6 6          02/23/2022    9:24 AM 11/29/2021    9:54 AM 02/19/2020    1:40 PM  GAD 7 : Generalized Anxiety Score  Nervous, Anxious, on Edge 0 0 0  Control/stop worrying 0 0 0  Worry too much - different things 0 0 0  Trouble relaxing 2 1 0  Restless 0 0 0  Easily annoyed or irritable 2 1 0  Afraid - awful might happen 0 0 0  Total GAD 7 Score 4 2 0  Anxiety Difficulty   Not difficult at all     Review of Systems:   Pertinent items are noted in HPI Denies abnormal vaginal discharge w/ itching/odor/irritation, headaches, visual changes, shortness of breath, chest pain, abdominal pain, severe nausea/vomiting, or problems with urination or bowel movements unless otherwise stated above. Pertinent History Reviewed:  Reviewed past medical,surgical, social, obstetrical and family history.  Reviewed problem list, medications and allergies. Physical Assessment:   Vitals:    04/05/22 0954  BP: 128/80  Pulse: 92  Weight: 231 lb (104.8 kg)  Body mass index is 37.28 kg/m.           Physical Examination:   General appearance: alert, well appearing, and in no distress  Mental status: alert, oriented to person, place, and time  Skin: warm & dry   Extremities: Edema: Trace    Cardiovascular: normal heart rate noted  Respiratory: normal respiratory effort, no distress  Abdomen: gravid, soft, non-tender  Pelvic: Cervical exam deferred         Fetal Status:     Movement: Present    Fetal Surveillance Testing today: BPP 8/8 normal UAD   Chaperone: N/A    Results for orders placed or performed in visit on 04/05/22 (from the past 24 hour(s))  POC Urinalysis Dipstick OB   Collection Time: 04/05/22 10:00 AM  Result Value Ref Range   Color, UA     Clarity, UA     Glucose, UA Negative Negative   Bilirubin, UA     Ketones, UA negative    Spec Grav, UA     Blood, UA negative  pH, UA     POC,PROTEIN,UA Negative Negative, Trace, Small (1+), Moderate (2+), Large (3+), 4+   Urobilinogen, UA     Nitrite, UA negative    Leukocytes, UA Negative Negative   Appearance     Odor      Assessment & Plan:  High-risk pregnancy: [id number] at [redacted]w[redacted]d with an Estimated Date of Delivery: 05/24/22      ICD-10-CM   1. Supervision of high risk pregnancy in third trimester  O09.93 POC Urinalysis Dipstick OB    2. Chronic hypertension affecting pregnancy  O10.919     3. 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 Herpes simplex  B00.9     5. [redacted] weeks gestation of pregnancy  Z3A.33 POC Urinalysis Dipstick OB        Meds: No orders of the defined types were placed in this encounter.   Orders:  Orders Placed This Encounter  Procedures   POC Urinalysis Dipstick OB     Labs/procedures today: U/S  Treatment Plan:  twice weekly surveillance IOL 39 or as indicated    Follow-up: No follow-ups on file.   Future Appointments  Date Time  Provider [place]  04/05/2022  3:30 PM [facility]RPC NURSE R[facility]-RPC RPC  04/08/2022  9:30 AM [facility] NURSE [facility]  04/12/2022  8:30 AM [facility] - [facility]YN [place] [facility]-FT[facility] None  04/12/2022  9:30 AM Janyth Pupa, DO [facility]  04/19/2022  8:30 AM [facility] - [facility] [place] [facility] None  04/19/2022  9:30 AM Florian Buff, MD [facility]  04/22/2022  9:30 AM [facility] NURSE [facility]  04/26/2022  9:30 AM [facility] NURSE [facility]  04/29/2022  9:15 AM [facility] - FTO[facility]YN [place] [facility] None  04/29/2022 10:10 AM Florian Buff, MD [facility]  05/03/2022  8:30 AM [place] - FTOBGYN [place] C[facility]-FT[facility]G None  05/03/2022  9:50 AM Florian Buff, MD [facility]  05/06/2022  9:30 AM [facility] NURSE [facility]  05/10/2022  8:30 AM [place] - FTOBGYN [place] C[facility]-FT[facility]G None  05/10/2022 10:30 AM Florian Buff, MD [facility]  05/13/2022  9:30 AM [facility] NURSE [facility]  05/20/2022  9:30 AM [facility] NURSE [facility]  09/30/2022 11:00 AM Lindell Spar, MD RPC-RPC RPC    Orders Placed This Encounter  Procedures   POC Urinalysis Dipstick OB   Florian Buff  Attending Physician for the [facility] 04/05/2022 10:53 AM

## 2022-04-08 ENCOUNTER — Ambulatory Visit (INDEPENDENT_AMBULATORY_CARE_PROVIDER_SITE_OTHER): Payer: Medicaid Other | Admitting: *Deleted

## 2022-04-08 VITALS — BP 124/73 | HR 107 | Wt 230.0 lb

## 2022-04-08 DIAGNOSIS — Z3A33 33 weeks gestation of pregnancy: Secondary | ICD-10-CM | POA: Diagnosis not present

## 2022-04-08 DIAGNOSIS — O288 Other abnormal findings on antenatal screening of mother: Secondary | ICD-10-CM

## 2022-04-08 DIAGNOSIS — O10919 Unspecified pre-existing hypertension complicating pregnancy, unspecified trimester: Secondary | ICD-10-CM | POA: Diagnosis not present

## 2022-04-08 DIAGNOSIS — O0993 Supervision of high risk pregnancy, unspecified, third trimester: Secondary | ICD-10-CM | POA: Diagnosis not present

## 2022-04-08 DIAGNOSIS — Z1389 Encounter for screening for other disorder: Secondary | ICD-10-CM

## 2022-04-08 DIAGNOSIS — Z331 Pregnant state, incidental: Secondary | ICD-10-CM

## 2022-04-08 LAB — POCT URINALYSIS DIPSTICK OB
Blood, UA: NEGATIVE
Glucose, UA: NEGATIVE
Ketones, UA: NEGATIVE
Leukocytes, UA: NEGATIVE
Nitrite, UA: NEGATIVE
POC,PROTEIN,UA: NEGATIVE

## 2022-04-08 NOTE — Progress Notes (Signed)
   NURSE VISIT- NST  SUBJECTIVE:  Laura Andersen is a 31 y.o. [phone] female at [redacted]w[redacted]d, here for a NST for pregnancy complicated by [facility].  She reports active fetal movement, contractions: none, vaginal bleeding: none, membranes: intact.   OBJECTIVE:  BP 124/73   Pulse (!) 107   Wt 230 lb (104.3 kg)   LMP 07/17/2021 (Exact Date)   BMI 37.12 kg/m   Appears well, no apparent distress  Results for orders placed or performed in visit on 04/08/22 (from the past 24 hour(s))  POC Urinalysis Dipstick OB   Collection Time: 04/08/22  9:46 AM  Result Value Ref Range   Color, UA     Clarity, UA     Glucose, UA Negative Negative   Bilirubin, UA     Ketones, UA neg    Spec Grav, UA     Blood, UA neg    pH, UA     POC,PROTEIN,UA Negative Negative, Trace, Small (1+), Moderate (2+), Large (3+), 4+   Urobilinogen, UA     Nitrite, UA neg    Leukocytes, UA Negative Negative   Appearance     Odor      NST: FHR baseline 135 bpm, Variability: moderate, Accelerations:present, Decelerations:  Absent= Cat 1/reactive Toco: none   ASSESSMENT: [id number] at [redacted]w[redacted]d with CHTN NST reactive  PLAN: EFM strip reviewed by Dr. Charlotta Newton   Recommendations: keep next appointment as scheduled    [name]  04/08/2022 11:16 AM

## 2022-04-11 ENCOUNTER — Other Ambulatory Visit: Payer: Self-pay | Admitting: Obstetrics & Gynecology

## 2022-04-11 DIAGNOSIS — O10919 Unspecified pre-existing hypertension complicating pregnancy, unspecified trimester: Secondary | ICD-10-CM

## 2022-04-12 ENCOUNTER — Ambulatory Visit (INDEPENDENT_AMBULATORY_CARE_PROVIDER_SITE_OTHER): Payer: Medicaid Other

## 2022-04-12 ENCOUNTER — Ambulatory Visit (INDEPENDENT_AMBULATORY_CARE_PROVIDER_SITE_OTHER): Payer: Medicaid Other | Admitting: Obstetrics & Gynecology

## 2022-04-12 ENCOUNTER — Encounter: Payer: Self-pay | Admitting: Obstetrics & Gynecology

## 2022-04-12 VITALS — BP 132/76 | HR 102 | Wt 230.4 lb

## 2022-04-12 DIAGNOSIS — Z3A34 34 weeks gestation of pregnancy: Secondary | ICD-10-CM | POA: Diagnosis not present

## 2022-04-12 DIAGNOSIS — O10919 Unspecified pre-existing hypertension complicating pregnancy, unspecified trimester: Secondary | ICD-10-CM

## 2022-04-12 DIAGNOSIS — O0993 Supervision of high risk pregnancy, unspecified, third trimester: Secondary | ICD-10-CM

## 2022-04-12 DIAGNOSIS — B009 Herpesviral infection, unspecified: Secondary | ICD-10-CM

## 2022-04-12 DIAGNOSIS — Q625 Duplication of ureter: Secondary | ICD-10-CM

## 2022-04-12 NOTE — Progress Notes (Signed)
[place] 34 wks,cephalic,BPP 8/8,anterior placenta gr 1, AFI 12 cm,LVEICF,FHR 144 bpm,RI .69,.61,.72=84%

## 2022-04-12 NOTE — Progress Notes (Signed)
HIGH-RISK PREGNANCY VISIT Patient name: Laura Andersen MRN [id number]  Date of birth: 15-Jan-1991 Chief Complaint:   Routine Prenatal Visit  History of Present Illness:   Laura Andersen is a 31 y.o. [id number] female at [redacted]w[redacted]d with an Estimated Date of Delivery: 05/24/22 being seen today for ongoing management of a high-risk pregnancy complicated by:  -Chronic HTN- on Labetalol 100mg  bid BP wnl at home, asymptomatic  -HSV- on suppression therapy Asymptomatic  Today she reports no complaints.   Contractions: Not present. Vag. Bleeding: None.  Movement: Present. denies leaking of fluid.      03/31/2022    2:13 PM 02/23/2022    9:24 AM 11/29/2021    9:54 AM 09/09/2021    3:07 PM 01/28/2021    1:31 PM  Depression screen PHQ 2/9  Decreased Interest 0 2 2 0 0  Down, Depressed, Hopeless 0 0 0 0 0  PHQ - 2 Score 0 2 2 0 0  Altered sleeping  2 2    Tired, decreased energy  2 2    Change in appetite  0 0    Feeling bad or failure about yourself   0 0    Trouble concentrating  0 0    Moving slowly or fidgety/restless  0 0    Suicidal thoughts  0 0    PHQ-9 Score  6 6       Current Outpatient Medications  Medication Instructions   aspirin EC 162 mg, Oral, Daily, Swallow whole.   Blood Pressure Monitor MISC For regular home bp monitoring during pregnancy   labetalol (NORMODYNE) 100 mg, Oral, 2 times daily   loratadine (CLARITIN) 10 mg, Oral, Daily PRN   Prenatal Vit-Fe Fumarate-FA (PRENATAL VITAMIN PO) Oral   valACYclovir (VALTREX) 1,000 mg, Oral, Daily     Review of Systems:   Pertinent items are noted in HPI Denies abnormal vaginal discharge w/ itching/odor/irritation, headaches, visual changes, shortness of breath, chest pain, abdominal pain, severe nausea/vomiting, or problems with urination or bowel movements unless otherwise stated above. Pertinent History Reviewed:  Reviewed past medical,surgical, social, obstetrical and family history.  Reviewed problem list,  medications and allergies. Physical Assessment:   Vitals:   04/12/22 0902  BP: 132/76  Pulse: (!) 102  Weight: 230 lb 6.4 oz (104.5 kg)  Body mass index is 37.19 kg/m.           Physical Examination:   General appearance: alert, well appearing, and in no distress  Mental status: normal mood, behavior, speech, dress, motor activity, and thought processes  Skin: warm & dry   Extremities: Edema: Trace    Cardiovascular: normal heart rate noted  Respiratory: normal respiratory effort, no distress  Abdomen: gravid, soft, non-tender  Pelvic: Cervical exam deferred         Fetal Status:     Movement: Present    Fetal Surveillance Testing today: cephalic,BPP 8/8,anterior placenta gr 1, AFI 12 cm,LVEICF,FHR 144 bpm,RI .69,.61,.72=84%    Chaperone: N/A    No results found for this or any previous visit (from the past 24 hour(s)).   Assessment & Plan:  High-risk pregnancy: 04/14/22 at [redacted]w[redacted]d with an Estimated Date of Delivery: 05/24/22   -Chronic HTN- continue current medication Reviewed IOL 38-39wks pending BP management Continue antepartum testing as scheduled, testing today appropriate as above  -HSV- on suppression therapy  Meds: No orders of the defined types were placed in this encounter.   Labs/procedures today: BPP/growth  Treatment Plan:  as outlined  above  Reviewed: Preterm labor symptoms and general obstetric precautions including but not limited to vaginal bleeding, contractions, leaking of fluid and fetal movement were reviewed in detail with the patient.  All questions were answered. Pt has home bp cuff. Check bp weekly, let us know if >140/90.   Follow-up: Return for twice weekly as scheduled.   Future Appointments  Date Time Provider [place]  04/19/2022  8:30 AM [facility] [place] [facility] None  04/19/2022  9:30 AM Florian Buff, MD [facility]TO[facility]YN  04/22/2022  9:30 AM [facility]FTOBGYN NURSE [facility]YN  04/29/2022  9:30 AM [facility] NURSE [facility]TO[facility]YN   05/03/2022  8:30 AM [facility] [place] [facility] None  05/03/2022  9:50 AM Florian Buff, MD [facility]FT FTOBGYN  05/06/2022  9:30 AM [facility]FTOBGY[facility] NURSE [facility]TO[facility]YN  05/10/2022  8:30 AM [facility] [place] [facility] None  05/10/2022 10:30 AM Florian Buff, MD [facility]FT FTOBGYN  05/13/2022  9:30 AM [facility]FTOBGYN NURSE [facility]TOBGYN  05/20/2022  9:30 AM [facility] NURSE [facility]TOBGYN  09/30/2022 11:00 AM Lindell Spar, MD [facility] R[facility]    No orders of the defined types were placed in this encounter.   Janyth Pupa, DO Attending [place], [facility] for [employer], [place]

## 2022-04-15 ENCOUNTER — Other Ambulatory Visit: Payer: Medicaid Other

## 2022-04-18 ENCOUNTER — Other Ambulatory Visit: Payer: Self-pay | Admitting: Obstetrics & Gynecology

## 2022-04-18 DIAGNOSIS — O10913 Unspecified pre-existing hypertension complicating pregnancy, third trimester: Secondary | ICD-10-CM

## 2022-04-19 ENCOUNTER — Ambulatory Visit (INDEPENDENT_AMBULATORY_CARE_PROVIDER_SITE_OTHER): Payer: Medicaid Other | Admitting: Obstetrics & Gynecology

## 2022-04-19 ENCOUNTER — Ambulatory Visit (INDEPENDENT_AMBULATORY_CARE_PROVIDER_SITE_OTHER): Payer: Medicaid Other

## 2022-04-19 VITALS — BP 118/78 | HR 96 | Wt 230.0 lb

## 2022-04-19 DIAGNOSIS — O10919 Unspecified pre-existing hypertension complicating pregnancy, unspecified trimester: Secondary | ICD-10-CM

## 2022-04-19 DIAGNOSIS — O10913 Unspecified pre-existing hypertension complicating pregnancy, third trimester: Secondary | ICD-10-CM

## 2022-04-19 DIAGNOSIS — O09893 Supervision of other high risk pregnancies, third trimester: Secondary | ICD-10-CM

## 2022-04-19 DIAGNOSIS — Z3A35 35 weeks gestation of pregnancy: Secondary | ICD-10-CM

## 2022-04-19 DIAGNOSIS — Q625 Duplication of ureter: Secondary | ICD-10-CM

## 2022-04-19 DIAGNOSIS — O0993 Supervision of high risk pregnancy, unspecified, third trimester: Secondary | ICD-10-CM

## 2022-04-19 LAB — POCT URINALYSIS DIPSTICK OB
Blood, UA: NEGATIVE
Glucose, UA: NEGATIVE
Ketones, UA: NEGATIVE
Nitrite, UA: NEGATIVE
POC,PROTEIN,UA: NEGATIVE

## 2022-04-19 MED ORDER — OMEPRAZOLE 20 MG PO CPDR: 20.0000 mg | DELAYED_RELEASE_CAPSULE | Freq: Every day | ORAL | 6 refills | Status: DC

## 2022-04-19 NOTE — Progress Notes (Signed)
HIGH-RISK PREGNANCY VISIT Patient name: Laura Andersen MRN [id number]  Date of birth: 02-16-1991 Chief Complaint:   Routine Prenatal Visit  History of Present Illness:   Laura Andersen is a 31 y.o. [id number] female at [redacted]w[redacted]d with an Estimated Date of Delivery: 05/24/22 being seen today for ongoing management of a high-risk pregnancy complicated by chronic hypertension currently on labetalol 100 BID.    Today she reports no complaints. Contractions: Irritability. Vag. Bleeding: None.  Movement: Present. denies leaking of fluid.      03/31/2022    2:13 PM 02/23/2022    9:24 AM 11/29/2021    9:54 AM 09/09/2021    3:07 PM 01/28/2021    1:31 PM  Depression screen PHQ 2/9  Decreased Interest 0 2 2 0 0  Down, Depressed, Hopeless 0 0 0 0 0  PHQ - 2 Score 0 2 2 0 0  Altered sleeping  2 2    Tired, decreased energy  2 2    Change in appetite  0 0    Feeling bad or failure about yourself   0 0    Trouble concentrating  0 0    Moving slowly or fidgety/restless  0 0    Suicidal thoughts  0 0    PHQ-9 Score  6 6          02/23/2022    9:24 AM 11/29/2021    9:54 AM 02/19/2020    1:40 PM  GAD 7 : Generalized Anxiety Score  Nervous, Anxious, on Edge 0 0 0  Control/stop worrying 0 0 0  Worry too much - different things 0 0 0  Trouble relaxing 2 1 0  Restless 0 0 0  Easily annoyed or irritable 2 1 0  Afraid - awful might happen 0 0 0  Total GAD 7 Score 4 2 0  Anxiety Difficulty   Not difficult at all     Review of Systems:   Pertinent items are noted in HPI Denies abnormal vaginal discharge w/ itching/odor/irritation, headaches, visual changes, shortness of breath, chest pain, abdominal pain, severe nausea/vomiting, or problems with urination or bowel movements unless otherwise stated above. Pertinent History Reviewed:  Reviewed past medical,surgical, social, obstetrical and family history.  Reviewed problem list, medications and allergies. Physical Assessment:   Vitals:    04/19/22 0907  BP: 118/78  Pulse: 96  Weight: 230 lb (104.3 kg)  Body mass index is 37.12 kg/m.           Physical Examination:   General appearance: alert, well appearing, and in no distress  Mental status: alert, oriented to person, place, and time  Skin: warm & dry   Extremities:      Cardiovascular: normal heart rate noted  Respiratory: normal respiratory effort, no distress  Abdomen: gravid, soft, non-tender  Pelvic: Cervical exam deferred         Fetal Status: Fetal Heart Rate (bpm): 144 Fundal Height: 35 cm Movement: Present Presentation: Vertex  Fetal Surveillance Testing today: BPP/UAD   Chaperone: N/A    Results for orders placed or performed in visit on 04/19/22 (from the past 24 hour(s))  POC Urinalysis Dipstick OB   Collection Time: 04/19/22  9:13 AM  Result Value Ref Range   Color, UA     Clarity, UA     Glucose, UA Negative Negative   Bilirubin, UA     Ketones, UA neg    Spec Grav, UA     Blood, UA neg  pH, UA     POC,PROTEIN,UA Negative Negative, Trace, Small (1+), Moderate (2+), Large (3+), 4+   Urobilinogen, UA     Nitrite, UA neg    Leukocytes, UA Small (1+) (A) Negative   Appearance     Odor      Assessment & Plan:  High-risk pregnancy: [id number] at [redacted]w[redacted]d with an Estimated Date of Delivery: 05/24/22      ICD-10-CM   1. Supervision of other high risk pregnancies, third trimester  O09.893 POC Urinalysis Dipstick OB    2. Chronic hypertension affecting pregnancy  O10.919    labetalol 100 BID with good control, BPP 8/8 w/good UAD/growth    3. 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 [redacted] weeks gestation of pregnancy  Z3A.35 POC Urinalysis Dipstick OB        Meds:  Meds ordered this encounter  Medications   omeprazole (PRILOSEC) 20 MG capsule    Sig: Take 1 capsule (20 mg total) by mouth daily. 1 tablet a day    Dispense:  30 capsule    Refill:  6    Orders:  Orders Placed This Encounter   Procedures   POC Urinalysis Dipstick OB     Labs/procedures today: U/S  Treatment Plan:  per protocol    Follow-up: No follow-ups on file.   Future Appointments  Date Time Provider Department Center  04/22/2022  9:30 AM [facility] NURSE [facility]  04/29/2022  9:30 AM [facility] NURSE [facility]  05/03/2022  8:30 AM [facility] - [facility] [place] [facility] None  05/03/2022  9:50 AM Lazaro Arms, MD [facility] FTOBGYN  05/06/2022  9:30 AM [facility]FTOBGY[facility] NURSE [facility]YN  05/10/2022  8:30 AM [facility] [place] [facility] None  05/10/2022 10:30 AM Lazaro Arms, MD [facility] FTOBGYN  05/13/2022  9:30 AM [facility]FT[facility]GY[facility] NURSE [facility]  05/20/2022  9:30 AM [facility] NURSE [facility]TO[facility]  09/30/2022 11:00 AM Anabel Halon, MD [facility] R[facility]    Orders Placed This Encounter  Procedures   POC Urinalysis Dipstick OB   Lazaro Arms  Attending Physician for the Center for [facility] 04/19/2022 9:32 AM

## 2022-04-19 NOTE — Progress Notes (Signed)
[place] 35 wks,cephalic,BPP 8/8,anterior placenta gr 1,FHR 144 bpm,AFI 12 cm,RI .58,.57=45%,LVEICF,duplicated left renal pelvis,EFW 2467 35%

## 2022-04-22 ENCOUNTER — Ambulatory Visit (INDEPENDENT_AMBULATORY_CARE_PROVIDER_SITE_OTHER): Payer: Medicaid Other | Admitting: *Deleted

## 2022-04-22 VITALS — BP 111/72 | HR 99 | Wt 230.0 lb

## 2022-04-22 DIAGNOSIS — Z1389 Encounter for screening for other disorder: Secondary | ICD-10-CM

## 2022-04-22 DIAGNOSIS — O288 Other abnormal findings on antenatal screening of mother: Secondary | ICD-10-CM | POA: Diagnosis not present

## 2022-04-22 DIAGNOSIS — Z3A35 35 weeks gestation of pregnancy: Secondary | ICD-10-CM

## 2022-04-22 DIAGNOSIS — O10919 Unspecified pre-existing hypertension complicating pregnancy, unspecified trimester: Secondary | ICD-10-CM

## 2022-04-22 DIAGNOSIS — O0993 Supervision of high risk pregnancy, unspecified, third trimester: Secondary | ICD-10-CM

## 2022-04-22 DIAGNOSIS — Z331 Pregnant state, incidental: Secondary | ICD-10-CM

## 2022-04-22 LAB — POCT URINALYSIS DIPSTICK OB
Blood, UA: NEGATIVE
Glucose, UA: NEGATIVE
Ketones, UA: NEGATIVE
Leukocytes, UA: NEGATIVE
Nitrite, UA: NEGATIVE

## 2022-04-22 NOTE — Progress Notes (Signed)
   NURSE VISIT- NST  SUBJECTIVE:  Laura Andersen is a 31 y.o. [phone] female at [redacted]w[redacted]d, here for a NST for pregnancy complicated by [facility].  She reports active fetal movement, contractions: none, vaginal bleeding: none, membranes: intact.   OBJECTIVE:  BP 111/72   Pulse 99   Wt 230 lb (104.3 kg)   LMP 07/17/2021 (Exact Date)   BMI 37.12 kg/m   Appears well, no apparent distress  Results for orders placed or performed in visit on 04/22/22 (from the past 24 hour(s))  POC Urinalysis Dipstick OB   Collection Time: 04/22/22  9:54 AM  Result Value Ref Range   Color, UA     Clarity, UA     Glucose, UA Negative Negative   Bilirubin, UA     Ketones, UA negative    Spec Grav, UA     Blood, UA negative    pH, UA     POC,PROTEIN,UA Trace Negative, Trace, Small (1+), Moderate (2+), Large (3+), 4+   Urobilinogen, UA     Nitrite, UA negative    Leukocytes, UA Negative Negative   Appearance     Odor      NST: FHR baseline 135 bpm, Variability: moderate, Accelerations:present, Decelerations:  Absent= Cat 1/reactive Toco: none   ASSESSMENT: [id number] at [redacted]w[redacted]d with CHTN NST reactive  PLAN: EFM strip reviewed by Dr. Charlotta Newton   Recommendations: keep next appointment as scheduled    [name]  04/22/2022 10:35 AM

## 2022-04-26 ENCOUNTER — Encounter: Payer: Medicaid Other | Admitting: Obstetrics & Gynecology

## 2022-04-26 ENCOUNTER — Ambulatory Visit (INDEPENDENT_AMBULATORY_CARE_PROVIDER_SITE_OTHER): Payer: Medicaid Other

## 2022-04-26 ENCOUNTER — Encounter: Payer: Self-pay | Admitting: Obstetrics & Gynecology

## 2022-04-26 ENCOUNTER — Other Ambulatory Visit (HOSPITAL_COMMUNITY)
Admission: RE | Admit: 2022-04-26 | Discharge: 2022-04-26 | Disposition: A | Discharge status: Home / Self Care | Payer: Medicaid Other | Source: Ambulatory Visit | Attending: Obstetrics & Gynecology | Admitting: Obstetrics & Gynecology

## 2022-04-26 ENCOUNTER — Ambulatory Visit (INDEPENDENT_AMBULATORY_CARE_PROVIDER_SITE_OTHER): Payer: Medicaid Other | Admitting: Obstetrics & Gynecology

## 2022-04-26 ENCOUNTER — Other Ambulatory Visit: Payer: Medicaid Other

## 2022-04-26 ENCOUNTER — Other Ambulatory Visit: Payer: Self-pay | Admitting: Obstetrics & Gynecology

## 2022-04-26 VITALS — BP 113/71 | HR 101 | Wt 231.0 lb

## 2022-04-26 DIAGNOSIS — O0993 Supervision of high risk pregnancy, unspecified, third trimester: Secondary | ICD-10-CM

## 2022-04-26 DIAGNOSIS — O10919 Unspecified pre-existing hypertension complicating pregnancy, unspecified trimester: Secondary | ICD-10-CM

## 2022-04-26 DIAGNOSIS — Z3A36 36 weeks gestation of pregnancy: Secondary | ICD-10-CM

## 2022-04-26 NOTE — Progress Notes (Signed)
[place] 36 wks,cephalic,BPP 8/8,FHR 152 bpm,AFI 11 cm,RI .55,.52,.61,.62=62%,anterior placenta gr 2

## 2022-04-26 NOTE — Progress Notes (Signed)
HIGH-RISK PREGNANCY VISIT Patient name: Laura Andersen MRN [id number]  Date of birth: [birth year] Chief Complaint:   Routine Prenatal Visit  History of Present Illness:   AUBRIELLE STROUD is a 31 y.o. [id number] female at [redacted]w[redacted]d with an Estimated Date of Delivery: 05/24/22 being seen today for ongoing management of a high-risk pregnancy complicated by:  -Chronic HTN- on Lab 100mg  bid -h/o HSV   Today she reports  noted mild headache/change in vision this am- BP normal, resolved.  Denies symptoms currently .   Contractions: Irritability. Vag. Bleeding: None.  Movement: Present. denies leaking of fluid.      03/31/2022    2:13 PM 02/23/2022    9:24 AM 11/29/2021    9:54 AM 09/09/2021    3:07 PM 01/28/2021    1:31 PM  Depression screen PHQ 2/9  Decreased Interest 0 2 2 0 0  Down, Depressed, Hopeless 0 0 0 0 0  PHQ - 2 Score 0 2 2 0 0  Altered sleeping  2 2    Tired, decreased energy  2 2    Change in appetite  0 0    Feeling bad or failure about yourself   0 0    Trouble concentrating  0 0    Moving slowly or fidgety/restless  0 0    Suicidal thoughts  0 0    PHQ-9 Score  6 6       Current Outpatient Medications  Medication Instructions   aspirin EC 162 mg, Oral, Daily, Swallow whole.   Blood Pressure Monitor MISC For regular home bp monitoring during pregnancy   labetalol (NORMODYNE) 100 mg, Oral, 2 times daily   loratadine (CLARITIN) 10 mg, Oral, Daily PRN   omeprazole (PRILOSEC) 20 mg, Oral, Daily, 1 tablet a day   Prenatal Vit-Fe Fumarate-FA (PRENATAL VITAMIN PO) Oral   valACYclovir (VALTREX) 1,000 mg, Oral, Daily     Review of Systems:   Pertinent items are noted in HPI Denies abnormal vaginal discharge w/ itching/odor/irritation, headaches, visual changes, shortness of breath, chest pain, abdominal pain, severe nausea/vomiting, or problems with urination or bowel movements unless otherwise stated above. Pertinent History Reviewed:  Reviewed past  medical,surgical, social, obstetrical and family history.  Reviewed problem list, medications and allergies. Physical Assessment:   Vitals:   04/26/22 0942  BP: 113/71  Pulse: (!) 101  Weight: 231 lb (104.8 kg)  Body mass index is 37.28 kg/m.           Physical Examination:   General appearance: alert, well appearing, and in no distress  Mental status: normal mood, behavior, speech, dress, motor activity, and thought processes  Skin: warm & dry   Extremities: Edema: None    Cardiovascular: normal heart rate noted  Respiratory: normal respiratory effort, no distress  Abdomen: gravid, soft, non-tender  Pelvic: Cervical exam performed  Dilation: Fingertip Effacement (%): 0, Thick Station: -3  Fetal Status:     Movement: Present    Fetal Surveillance Testing today: cephalic,BPP 8/8,FHR 152 bpm,AFI 11 cm,RI .55,.52,.61,.62=62%,anterior placenta gr 2    Chaperone: N/A    No results found for this or any previous visit (from the past 24 hour(s)).   Assessment & Plan:  High-risk pregnancy: 14/05/23 at [redacted]w[redacted]d with an Estimated Date of Delivery: 05/24/22   1) Chronic HTN -continue twice weekly testing -reviewed IOL 38-39wks  2) HSV -on suppression -asymptomatic  Meds: No orders of the defined types were placed in this encounter.   Labs/procedures today: GBS,  GC/C  Treatment Plan:  as outlined above  Reviewed: Preterm labor symptoms and general obstetric precautions including but not limited to vaginal bleeding, contractions, leaking of fluid and fetal movement were reviewed in detail with the patient.  All questions were answered. Pt has home bp cuff. Check bp weekly, let us know if >140/90.   Follow-up: Return for as scheduled.   Future Appointments  Date Time Provider [place]  04/29/2022  9:30 AM [facility]-FTOBGYN NURSE [facility]-FT FTOBGYN  05/03/2022  8:30 AM [place] - FTOBGYN [place] [facility] None  05/03/2022  9:50 AM Florian Buff, MD [facility]-FT FTOBGYN  05/06/2022  9:30 AM  [facility]-FTOBGYN NURSE [facility]-FT FTO[facility]YN  05/10/2022  8:30 AM [place] - FTOBGYN [place] [facility]-FT[facility] None  05/10/2022 10:30 AM Florian Buff, MD [facility]-FT FTOBGYN  05/13/2022  9:30 AM [facility]FTOBGYN NURSE [facility]-FT FTOBGYN  05/20/2022  9:30 AM [facility]FT[facility]GY[facility] NURSE [facility]-FT FTO[facility]YN  09/30/2022 11:00 AM Lindell Spar, MD [facility] RPC    Orders Placed This Encounter  Procedures   Culture, beta strep (group b only)    Janyth Pupa, DO Attending [place], [place] for [employer], [place]

## 2022-04-27 LAB — CERVICOVAGINAL ANCILLARY ONLY
Chlamydia: NEGATIVE
Comment: NEGATIVE
Comment: NORMAL
Neisseria Gonorrhea: NEGATIVE

## 2022-04-29 ENCOUNTER — Other Ambulatory Visit: Payer: Medicaid Other

## 2022-04-29 ENCOUNTER — Encounter: Payer: Medicaid Other | Admitting: Obstetrics & Gynecology

## 2022-04-29 ENCOUNTER — Ambulatory Visit (INDEPENDENT_AMBULATORY_CARE_PROVIDER_SITE_OTHER): Payer: Medicaid Other | Admitting: *Deleted

## 2022-04-29 VITALS — BP 135/78 | HR 102

## 2022-04-29 DIAGNOSIS — O10919 Unspecified pre-existing hypertension complicating pregnancy, unspecified trimester: Secondary | ICD-10-CM

## 2022-04-29 DIAGNOSIS — Z3A36 36 weeks gestation of pregnancy: Secondary | ICD-10-CM

## 2022-04-29 DIAGNOSIS — O0993 Supervision of high risk pregnancy, unspecified, third trimester: Secondary | ICD-10-CM | POA: Diagnosis not present

## 2022-04-29 LAB — CULTURE, BETA STREP (GROUP B ONLY): Strep Gp B Culture: POSITIVE — AB

## 2022-04-29 NOTE — Progress Notes (Addendum)
   NURSE VISIT- NST  SUBJECTIVE:  Laura Andersen is a 31 y.o. [phone] female at [redacted]w[redacted]d, here for a NST for pregnancy complicated by [facility].  She reports active fetal movement, contractions: none, vaginal bleeding: none, membranes: intact.   OBJECTIVE:  BP 135/78   Pulse (!) 102   LMP 07/17/2021 (Exact Date)   Appears well, no apparent distress  No results found for this or any previous visit (from the past 24 hour(s)).  NST: FHR baseline 140 bpm, Variability: moderate, Accelerations:present, Decelerations:  Absent= Cat 1/reactive Toco: none   ASSESSMENT: [id number] at [redacted]w[redacted]d with CHTN NST reactive  PLAN: EFM strip reviewed by Dr. Despina Hidden   Recommendations: keep next appointment as scheduled    Laura Andersen  04/29/2022 10:17 AM

## 2022-05-02 ENCOUNTER — Other Ambulatory Visit: Payer: Self-pay | Admitting: Obstetrics & Gynecology

## 2022-05-02 DIAGNOSIS — O10919 Unspecified pre-existing hypertension complicating pregnancy, unspecified trimester: Secondary | ICD-10-CM

## 2022-05-02 DIAGNOSIS — Z0289 Encounter for other administrative examinations: Secondary | ICD-10-CM

## 2022-05-03 ENCOUNTER — Encounter (HOSPITAL_COMMUNITY): Payer: Self-pay | Admitting: *Deleted

## 2022-05-03 ENCOUNTER — Telehealth (HOSPITAL_COMMUNITY): Payer: Self-pay | Admitting: *Deleted

## 2022-05-03 ENCOUNTER — Encounter: Payer: Self-pay | Admitting: Obstetrics & Gynecology

## 2022-05-03 ENCOUNTER — Ambulatory Visit (INDEPENDENT_AMBULATORY_CARE_PROVIDER_SITE_OTHER): Payer: Medicaid Other | Admitting: Obstetrics & Gynecology

## 2022-05-03 ENCOUNTER — Ambulatory Visit (INDEPENDENT_AMBULATORY_CARE_PROVIDER_SITE_OTHER): Payer: Medicaid Other

## 2022-05-03 ENCOUNTER — Encounter (HOSPITAL_COMMUNITY): Payer: Self-pay

## 2022-05-03 VITALS — BP 122/76 | HR 96 | Wt 234.0 lb

## 2022-05-03 DIAGNOSIS — O10919 Unspecified pre-existing hypertension complicating pregnancy, unspecified trimester: Secondary | ICD-10-CM | POA: Diagnosis not present

## 2022-05-03 DIAGNOSIS — Z3A37 37 weeks gestation of pregnancy: Secondary | ICD-10-CM

## 2022-05-03 DIAGNOSIS — Q625 Duplication of ureter: Secondary | ICD-10-CM

## 2022-05-03 DIAGNOSIS — O0993 Supervision of high risk pregnancy, unspecified, third trimester: Secondary | ICD-10-CM

## 2022-05-03 NOTE — Telephone Encounter (Signed)
Preadmission screen  

## 2022-05-03 NOTE — Progress Notes (Signed)
[place] 37 wks,cephalic,anterior placenta gr 2,BPP 8/8,AFI 11 cm,FHR 144 bpm,RI .63,.58,.58=68%

## 2022-05-03 NOTE — Progress Notes (Signed)
HIGH-RISK PREGNANCY VISIT Patient name: Laura Andersen MRN [id number]  Date of birth: [birth year] Chief Complaint:   Routine Prenatal Visit  History of Present Illness:   Laura Andersen is a 31 y.o. [id number] female at [redacted]w[redacted]d with an Estimated Date of Delivery: 05/24/22 being seen today for ongoing management of a high-risk pregnancy complicated by chronic hypertension currently on labetalol 100 mg BID.    Today she reports no complaints. Contractions: Irritability. Vag. Bleeding: None.  Movement: Present. denies leaking of fluid.      03/31/2022    2:13 PM 02/23/2022    9:24 AM 11/29/2021    9:54 AM 09/09/2021    3:07 PM 01/28/2021    1:31 PM  Depression screen PHQ 2/9  Decreased Interest 0 2 2 0 0  Down, Depressed, Hopeless 0 0 0 0 0  PHQ - 2 Score 0 2 2 0 0  Altered sleeping  2 2    Tired, decreased energy  2 2    Change in appetite  0 0    Feeling bad or failure about yourself   0 0    Trouble concentrating  0 0    Moving slowly or fidgety/restless  0 0    Suicidal thoughts  0 0    PHQ-9 Score  6 6          02/23/2022    9:24 AM 11/29/2021    9:54 AM 02/19/2020    1:40 PM  GAD 7 : Generalized Anxiety Score  Nervous, Anxious, on Edge 0 0 0  Control/stop worrying 0 0 0  Worry too much - different things 0 0 0  Trouble relaxing 2 1 0  Restless 0 0 0  Easily annoyed or irritable 2 1 0  Afraid - awful might happen 0 0 0  Total GAD 7 Score 4 2 0  Anxiety Difficulty   Not difficult at all     Review of Systems:   Pertinent items are noted in HPI Denies abnormal vaginal discharge w/ itching/odor/irritation, headaches, visual changes, shortness of breath, chest pain, abdominal pain, severe nausea/vomiting, or problems with urination or bowel movements unless otherwise stated above. Pertinent History Reviewed:  Reviewed past medical,surgical, social, obstetrical and family history.  Reviewed problem list, medications and allergies. Physical Assessment:   Vitals:    05/03/22 0934  BP: 122/76  Pulse: 96  Weight: 234 lb (106.1 kg)  Body mass index is 37.77 kg/m.           Physical Examination:   General appearance: alert, well appearing, and in no distress  Mental status: alert, oriented to person, place, and time  Skin: warm & dry   Extremities: Edema: None    Cardiovascular: normal heart rate noted  Respiratory: normal respiratory effort, no distress  Abdomen: gravid, soft, non-tender  Pelvic: Cervical exam deferred         Fetal Status:     Movement: Present    Fetal Surveillance Testing today: BPP 8/8 normal UAD  Chaperone: N/A    No results found for this or any previous visit (from the past 24 hour(s)).  Assessment & Plan:  High-risk pregnancy: [id number] at [redacted]w[redacted]d with an Estimated Date of Delivery: 05/24/22      ICD-10-CM   1. Supervision of high risk pregnancy in third trimester  O09.93     2. Chronic hypertension affecting pregnancy  O10.919     3. 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   Orders: No orders of the defined types were placed in this encounter.    Labs/procedures today: none  Treatment Plan:  twice weekly surveillance  BP at home doing well  Follow-up: Return for keep scheduled.   Future Appointments  Date Time Provider Department Center  05/06/2022  9:30 AM [facility]FT[facility] NURSE [facility]TO[facility]YN  05/10/2022  7:00 AM [facility] SCHED ROOM [facility]IN[facility] None  09/30/2022 11:00 AM Anabel Halon, MD [facility] R[facility]    No orders of the defined types were placed in this encounter.  Lazaro Arms  Attending Physician for the Center for [facility] 05/03/2022 10:25 AM

## 2022-05-04 ENCOUNTER — Other Ambulatory Visit: Payer: Self-pay | Admitting: Advanced Practice Midwife

## 2022-05-06 ENCOUNTER — Ambulatory Visit (INDEPENDENT_AMBULATORY_CARE_PROVIDER_SITE_OTHER): Payer: Medicaid Other | Admitting: *Deleted

## 2022-05-06 VITALS — BP 115/79 | HR 98 | Wt 234.0 lb

## 2022-05-06 DIAGNOSIS — Z3A37 37 weeks gestation of pregnancy: Secondary | ICD-10-CM

## 2022-05-06 DIAGNOSIS — Z1389 Encounter for screening for other disorder: Secondary | ICD-10-CM

## 2022-05-06 DIAGNOSIS — O10919 Unspecified pre-existing hypertension complicating pregnancy, unspecified trimester: Secondary | ICD-10-CM | POA: Diagnosis not present

## 2022-05-06 DIAGNOSIS — Z331 Pregnant state, incidental: Secondary | ICD-10-CM

## 2022-05-06 DIAGNOSIS — O288 Other abnormal findings on antenatal screening of mother: Secondary | ICD-10-CM

## 2022-05-06 DIAGNOSIS — O0993 Supervision of high risk pregnancy, unspecified, third trimester: Secondary | ICD-10-CM

## 2022-05-06 LAB — POCT URINALYSIS DIPSTICK OB
Blood, UA: NEGATIVE
Glucose, UA: NEGATIVE
Ketones, UA: NEGATIVE
Leukocytes, UA: NEGATIVE
Nitrite, UA: NEGATIVE
POC,PROTEIN,UA: NEGATIVE

## 2022-05-06 NOTE — Progress Notes (Signed)
   NURSE VISIT- NST  SUBJECTIVE:  Laura Andersen is a 31 y.o. [phone] female at [redacted]w[redacted]d, here for a NST for pregnancy complicated by [facility]  She reports active fetal movement, contractions: irritability, vaginal bleeding: none, membranes: intact.   OBJECTIVE:  BP 115/79   Pulse 98   Wt 234 lb (106.1 kg)   LMP 07/17/2021 (Exact Date)   BMI 37.77 kg/m   Appears well, no apparent distress  Results for orders placed or performed in visit on 05/06/22 (from the past 24 hour(s))  POC Urinalysis Dipstick OB   Collection Time: 05/06/22 10:06 AM  Result Value Ref Range   Color, UA     Clarity, UA     Glucose, UA Negative Negative   Bilirubin, UA     Ketones, UA negative    Spec Grav, UA     Blood, UA negative    pH, UA     POC,PROTEIN,UA Negative Negative, Trace, Small (1+), Moderate (2+), Large (3+), 4+   Urobilinogen, UA     Nitrite, UA negative    Leukocytes, UA Negative Negative   Appearance     Odor      NST: FHR baseline 140 bpm, Variability: minimal/moderate, Accelerations:present, Decelerations:  Absent= Cat 1/reactive Toco: none   ASSESSMENT: [id number] at [redacted]w[redacted]d with CHTN NST reactive  PLAN: EFM strip reviewed by Dr. Despina Hidden   Recommendations: keep next appointment as scheduled    [name]  05/06/2022 10:50 AM

## 2022-05-10 ENCOUNTER — Other Ambulatory Visit: Payer: Self-pay

## 2022-05-10 ENCOUNTER — Encounter: Payer: Self-pay | Admitting: Obstetrics & Gynecology

## 2022-05-10 ENCOUNTER — Inpatient Hospital Stay (HOSPITAL_COMMUNITY): Payer: Medicaid Other

## 2022-05-10 ENCOUNTER — Encounter (HOSPITAL_COMMUNITY): Payer: Self-pay | Admitting: Obstetrics & Gynecology

## 2022-05-10 ENCOUNTER — Inpatient Hospital Stay (HOSPITAL_COMMUNITY)
Admission: RE | Admit: 2022-05-10 | Discharge: 2022-05-13 | DRG: 806 | Disposition: A | Discharge status: Home / Self Care | Payer: Medicaid Other | Attending: Obstetrics and Gynecology | Admitting: Obstetrics and Gynecology

## 2022-05-10 ENCOUNTER — Encounter: Payer: Medicaid Other | Admitting: Obstetrics & Gynecology

## 2022-05-10 ENCOUNTER — Other Ambulatory Visit: Payer: Medicaid Other

## 2022-05-10 DIAGNOSIS — O99214 Obesity complicating childbirth: Secondary | ICD-10-CM | POA: Diagnosis not present

## 2022-05-10 DIAGNOSIS — O99824 Streptococcus B carrier state complicating childbirth: Secondary | ICD-10-CM | POA: Diagnosis not present

## 2022-05-10 DIAGNOSIS — B009 Herpesviral infection, unspecified: Secondary | ICD-10-CM | POA: Diagnosis present

## 2022-05-10 DIAGNOSIS — O99892 Other specified diseases and conditions complicating childbirth: Secondary | ICD-10-CM | POA: Diagnosis not present

## 2022-05-10 DIAGNOSIS — O9832 Other infections with a predominantly sexual mode of transmission complicating childbirth: Secondary | ICD-10-CM | POA: Diagnosis not present

## 2022-05-10 DIAGNOSIS — Q638 Other specified congenital malformations of kidney: Secondary | ICD-10-CM | POA: Diagnosis not present

## 2022-05-10 DIAGNOSIS — O10919 Unspecified pre-existing hypertension complicating pregnancy, unspecified trimester: Secondary | ICD-10-CM | POA: Diagnosis present

## 2022-05-10 DIAGNOSIS — I10 Essential (primary) hypertension: Secondary | ICD-10-CM | POA: Diagnosis present

## 2022-05-10 DIAGNOSIS — Z87891 Personal history of nicotine dependence: Secondary | ICD-10-CM | POA: Diagnosis not present

## 2022-05-10 DIAGNOSIS — Z880 Allergy status to penicillin: Secondary | ICD-10-CM | POA: Diagnosis not present

## 2022-05-10 DIAGNOSIS — Z3A38 38 weeks gestation of pregnancy: Secondary | ICD-10-CM

## 2022-05-10 DIAGNOSIS — O0990 Supervision of high risk pregnancy, unspecified, unspecified trimester: Secondary | ICD-10-CM

## 2022-05-10 DIAGNOSIS — O1002 Pre-existing essential hypertension complicating childbirth: Secondary | ICD-10-CM | POA: Diagnosis not present

## 2022-05-10 DIAGNOSIS — Z87442 Personal history of urinary calculi: Secondary | ICD-10-CM

## 2022-05-10 DIAGNOSIS — O99820 Streptococcus B carrier state complicating pregnancy: Secondary | ICD-10-CM | POA: Diagnosis not present

## 2022-05-10 DIAGNOSIS — A6000 Herpesviral infection of urogenital system, unspecified: Secondary | ICD-10-CM | POA: Diagnosis present

## 2022-05-10 LAB — CBC
HCT: 34.8 % — ABNORMAL LOW (ref 36.0–46.0)
Hemoglobin: 11.8 g/dL — ABNORMAL LOW (ref 12.0–15.0)
MCH: 31.1 pg (ref 26.0–34.0)
MCHC: 33.9 g/dL (ref 30.0–36.0)
MCV: 91.6 fL (ref 80.0–100.0)
Platelets: 321 10*3/uL (ref 150–400)
RBC: 3.8 MIL/uL — ABNORMAL LOW (ref 3.87–5.11)
RDW: 13.1 % (ref 11.5–15.5)
WBC: 9.6 10*3/uL (ref 4.0–10.5)
nRBC: 0 % (ref 0.0–0.2)

## 2022-05-10 LAB — TYPE AND SCREEN
ABO/RH(D): O POS
Antibody Screen: NEGATIVE

## 2022-05-10 LAB — HIV ANTIBODY (ROUTINE TESTING W REFLEX): HIV Screen 4th Generation wRfx: NONREACTIVE

## 2022-05-10 MED ORDER — CEFAZOLIN SODIUM-DEXTROSE 1-4 GM/50ML-% IV SOLN
1.0000 g | Freq: Three times a day (TID) | INTRAVENOUS | Status: DC
  Administered 2022-05-11 (×2): 1 g via INTRAVENOUS
  Filled 2022-05-10 (×3): qty 50

## 2022-05-10 MED ORDER — LACTATED RINGERS IV SOLN: INTRAVENOUS | Status: DC

## 2022-05-10 MED ORDER — OXYTOCIN-SODIUM CHLORIDE 30-0.9 UT/500ML-% IV SOLN: 2.5000 [IU]/h | INTRAVENOUS | Status: DC

## 2022-05-10 MED ORDER — MISOPROSTOL 50MCG HALF TABLET
50.0000 ug | ORAL_TABLET | Freq: Once | ORAL | Status: AC
  Administered 2022-05-10: 50 ug via ORAL
  Filled 2022-05-10: qty 1

## 2022-05-10 MED ORDER — OXYCODONE-ACETAMINOPHEN 5-325 MG PO TABS: 2.0000 | ORAL_TABLET | ORAL | Status: DC | PRN

## 2022-05-10 MED ORDER — ACETAMINOPHEN 325 MG PO TABS: 650.0000 mg | ORAL_TABLET | ORAL | Status: DC | PRN

## 2022-05-10 MED ORDER — LIDOCAINE HCL (PF) 1 % IJ SOLN: 30.0000 mL | INTRAMUSCULAR | Status: DC | PRN

## 2022-05-10 MED ORDER — ONDANSETRON HCL 4 MG/2ML IJ SOLN
4.0000 mg | Freq: Four times a day (QID) | INTRAMUSCULAR | Status: DC | PRN
  Administered 2022-05-11: 4 mg via INTRAVENOUS
  Filled 2022-05-10: qty 2

## 2022-05-10 MED ORDER — MISOPROSTOL 25 MCG QUARTER TABLET
25.0000 ug | ORAL_TABLET | Freq: Once | ORAL | Status: AC
  Administered 2022-05-10: 25 ug via VAGINAL
  Filled 2022-05-10: qty 1

## 2022-05-10 MED ORDER — OXYCODONE-ACETAMINOPHEN 5-325 MG PO TABS: 1.0000 | ORAL_TABLET | ORAL | Status: DC | PRN

## 2022-05-10 MED ORDER — LACTATED RINGERS IV SOLN: 500.0000 mL | INTRAVENOUS | Status: DC | PRN

## 2022-05-10 MED ORDER — SODIUM CHLORIDE 0.9 % IV SOLN: 5.0000 10*6.[IU] | Freq: Once | INTRAVENOUS | Status: DC

## 2022-05-10 MED ORDER — SOD CITRATE-CITRIC ACID 500-334 MG/5ML PO SOLN: 30.0000 mL | ORAL | Status: DC | PRN

## 2022-05-10 MED ORDER — TERBUTALINE SULFATE 1 MG/ML IJ SOLN: 0.2500 mg | Freq: Once | INTRAMUSCULAR | Status: DC | PRN

## 2022-05-10 MED ORDER — OXYTOCIN BOLUS FROM INFUSION
333.0000 mL | Freq: Once | INTRAVENOUS | Status: AC
  Administered 2022-05-11: 333 mL via INTRAVENOUS

## 2022-05-10 MED ORDER — CEFAZOLIN SODIUM-DEXTROSE 2-4 GM/100ML-% IV SOLN
2.0000 g | Freq: Once | INTRAVENOUS | Status: AC
  Administered 2022-05-10: 2 g via INTRAVENOUS
  Filled 2022-05-10: qty 100

## 2022-05-10 MED ORDER — FENTANYL CITRATE (PF) 100 MCG/2ML IJ SOLN
50.0000 ug | INTRAMUSCULAR | Status: DC | PRN
  Administered 2022-05-11: 50 ug via INTRAVENOUS
  Filled 2022-05-10: qty 2

## 2022-05-10 MED ORDER — MISOPROSTOL 25 MCG QUARTER TABLET: 25.0000 ug | ORAL_TABLET | ORAL | Status: DC

## 2022-05-10 MED ORDER — PENICILLIN G POT IN DEXTROSE 60000 UNIT/ML IV SOLN: 3.0000 10*6.[IU] | INTRAVENOUS | Status: DC

## 2022-05-10 NOTE — H&P (Addendum)
OBSTETRIC ADMISSION HISTORY AND PHYSICAL  Laura Andersen is a 31 y.o. female [phone] with IUP at [redacted]w[redacted]d by early [place] presenting for IOL due to [place]. She reports +FMs, No LOF, no VB, no blurry vision, headaches or peripheral edema, and RUQ pain.  She plans on breast and bottle feeding. She requests postpartum hormonal IUD for birth control. She received her prenatal care at [facility]   Dating: By early [place] --->  Estimated Date of Delivery: 05/24/22  Sono:    @[redacted]w[redacted]d , CWD, normal anatomy, cephalic presentation,  2467g, 35% EFW  Prenatal History/Complications:  - LVEICF and duplicated left renal pelvis on - cHTN controlled on labetalol 100 mg BID - History of HSV on outpatient suppressive therapy  Past Medical History: Past Medical History:  Diagnosis Date   Furuncle of groin 02/06/2019   Herpes    Hypertension    Renal disorder    kidney stones    Past Surgical History: Past Surgical History:  Procedure Laterality Date   COSMETIC SURGERY N/A    Phreesia 04/07/2020   DILATION AND CURETTAGE OF UTERUS      Obstetrical History: OB History     Gravida  5   Para  2   Term  2   Preterm      AB  2   Living  2      SAB  1   IAB      Ectopic      Multiple      Live Births  2           Social History Social History   Socioeconomic History   Marital status: Single    Spouse name: Not on file   Number of children: Not on file   Years of education: Not on file   Highest education level: Not on file  Occupational History   Not on file  Tobacco Use   Smoking status: Former    Packs/day: 0.25    Types: Cigarettes    Quit date: 02/10/2020    Years since quitting: 2.2   Smokeless tobacco: Never  Vaping Use   Vaping Use: Never used  Substance and Sexual Activity   Alcohol use: Not Currently    Comment: occ   Drug use: No   Sexual activity: Yes    Birth control/protection: None  Other Topics Concern   Not on file  Social History Narrative    Not on file   Social Determinants of Health   Financial Resource Strain: Low Risk  (02/23/2022)   Overall Financial Resource Strain (CARDIA)    Difficulty of Paying Living Expenses: Not very hard  Food Insecurity: No Food Insecurity (05/10/2022)   Hunger Vital Sign    Worried About Running Out of Food in the Last Year: Never true    Ran Out of Food in the Last Year: Never true  Transportation Needs: No Transportation Needs (05/10/2022)   PRAPARE - 05/12/2022 (Medical): No    Lack of Transportation (Non-Medical): No  Physical Activity: Inactive (02/23/2022)   Exercise Vital Sign    Days of Exercise per Week: 0 days    Minutes of Exercise per Session: 0 min  Stress: No Stress Concern Present (02/23/2022)   04/25/2022 of Occupational Health - Occupational Stress Questionnaire    Feeling of Stress : Not at all  Social Connections: Moderately Isolated (02/23/2022)   Social Connection and Isolation Panel [NHANES]    Frequency of  Communication with Friends and Family: More than three times a week    Frequency of Social Gatherings with Friends and Family: Once a week    Attends Religious Services: 1 to 4 times per year    Active Member of [employer] or Organizations: No    Attends Music therapist: Never    Marital Status: Never married    Family History: Family History  Problem Relation Age of Onset   Hypertension Mother    Hypertension Father    Stroke Father    Diabetes Father    Heart attack Father    Asthma Son    Cancer Maternal Grandmother    Asthma Maternal Grandfather     Allergies: Allergies  Allergen Reactions   Amoxicillin Itching   Benadryl [Diphenhydramine] Swelling    Medications Prior to Admission  Medication Sig Dispense Refill Last Dose   aspirin EC 81 MG tablet Take 2 tablets (162 mg total) by mouth daily. Swallow whole. (Patient taking differently: Take 81 mg by mouth daily. Swallow whole.) 180 tablet 2  05/10/2022   Blood Pressure Monitor MISC For regular home bp monitoring during pregnancy 1 each 0 05/09/2022   labetalol (NORMODYNE) 100 MG tablet Take 1 tablet (100 mg total) by mouth 2 (two) times daily. 60 tablet 6 05/10/2022   omeprazole (PRILOSEC) 20 MG capsule Take 1 capsule (20 mg total) by mouth daily. 1 tablet a day 30 capsule 6 05/10/2022   Prenatal Vit-Fe Fumarate-FA (PRENATAL VITAMIN PO) Take by mouth.   05/10/2022   valACYclovir (VALTREX) 1000 MG tablet Take 1 tablet (1,000 mg total) by mouth daily. 30 tablet 12 05/10/2022   loratadine (CLARITIN) 10 MG tablet Take 1 tablet (10 mg total) by mouth daily as needed for allergies or itching. 30 tablet 5      Review of Systems   All systems reviewed and negative except as stated in HPI  Height 5\' 6"  (1.676 m), weight 105.9 kg, last menstrual period 07/17/2021. General appearance: alert, cooperative, and no distress Lungs: no WOB on RA Abdomen: gravid abdomen Extremities: no sign of DVT Presentation: cephalic Fetal monitoringBaseline: 150 bpm, Variability: Good {> 6 bpm), Accelerations: Reactive, and Decelerations: Absent Uterine activityNone Dilation: 1 Effacement (%): Thick Station: -3 Exam by:: Dr. Miguel Dibble, CNM   Prenatal labs: ABO, Rh: O/Positive/-- (07/10 1028) Antibody: Negative (10/04 0857) Rubella: 1.83 (07/10 1028) RPR: Non Reactive (10/04 0857)  HBsAg: Negative (07/10 1028)  HIV: Non Reactive (10/04 0857)  GBS: Positive/-- (12/05 1130)  1 hr Glucola: WNL Genetic screening:  normal panorama, declined AFP, too late NT/IT Anatomy US: LVEICF  Prenatal Transfer Tool  Maternal Diabetes: No Genetic Screening: Normal panorama  Maternal Ultrasounds/Referrals: Normal Fetal Ultrasounds or other Referrals:  None Maternal Substance Abuse:  No Significant Maternal Medications:  None Significant Maternal Lab Results: Group B Strep positive  No results found for this or any previous visit (from the past 24  hour(s)).  Patient Active Problem List   Diagnosis Date Noted   Encounter for general adult medical examination with abnormal findings [id number]   Duplicated left renal collecting system 03/22/2022   Supervision of high-risk pregnancy 11/29/2021   Chronic hypertension affecting pregnancy 11/29/2021   Syncope 05/05/2020   Essential (primary) hypertension 04/08/2020   S/P cosmetic plastic surgery 04/08/2020   Obesity (BMI 30.0-34.9) 04/08/2020   Herpes simplex 02/06/2019    Assessment/Plan:  NARJES BYFORD is a 31 y.o. [id number] at [redacted]w[redacted]d here for IOL due to [place].  #Labor:Latent. Start  cytotec 50/25 and insert FB. #Pain: Epidural #FWB: Cat 1 #ID:  GBS positive, will give Ancef given PCN allergy #MOF: Both #MOC: PP hormonal IUD #Circ:  Yes  Ethelene Hal, MD  [facility] [place] 05/10/2022, 7:55 PM  I spoke with and examined patient and agree with resident/PA-S/MS/SNM's note and plan of care.  Cervical foley bulb inserted and inflated w/ 36ml LR w/o difficulty  Cytotec 50po/25vag x 1, then 50mcg vag q4hr Plan pit when foley out and 4hr from last cytotec Pt wants to wait til pp visit for IUD Roma Schanz, CNM, [facility] 05/10/2022 8:45 PM

## 2022-05-11 ENCOUNTER — Inpatient Hospital Stay (HOSPITAL_COMMUNITY): Payer: Medicaid Other | Admitting: Anesthesiology

## 2022-05-11 ENCOUNTER — Encounter (HOSPITAL_COMMUNITY): Payer: Self-pay | Admitting: Obstetrics & Gynecology

## 2022-05-11 DIAGNOSIS — O10919 Unspecified pre-existing hypertension complicating pregnancy, unspecified trimester: Secondary | ICD-10-CM | POA: Diagnosis not present

## 2022-05-11 DIAGNOSIS — O9832 Other infections with a predominantly sexual mode of transmission complicating childbirth: Secondary | ICD-10-CM | POA: Diagnosis not present

## 2022-05-11 DIAGNOSIS — Z3A38 38 weeks gestation of pregnancy: Secondary | ICD-10-CM | POA: Diagnosis not present

## 2022-05-11 DIAGNOSIS — O1002 Pre-existing essential hypertension complicating childbirth: Secondary | ICD-10-CM | POA: Diagnosis not present

## 2022-05-11 DIAGNOSIS — O164 Unspecified maternal hypertension, complicating childbirth: Secondary | ICD-10-CM | POA: Diagnosis not present

## 2022-05-11 DIAGNOSIS — O99820 Streptococcus B carrier state complicating pregnancy: Secondary | ICD-10-CM | POA: Diagnosis not present

## 2022-05-11 LAB — CBC WITH DIFFERENTIAL/PLATELET
Abs Immature Granulocytes: 0.07 10*3/uL (ref 0.00–0.07)
Basophils Absolute: 0 10*3/uL (ref 0.0–0.1)
Basophils Relative: 0 %
Eosinophils Absolute: 0.1 10*3/uL (ref 0.0–0.5)
Eosinophils Relative: 1 %
HCT: 35.5 % — ABNORMAL LOW (ref 36.0–46.0)
Hemoglobin: 11.9 g/dL — ABNORMAL LOW (ref 12.0–15.0)
Immature Granulocytes: 1 %
Lymphocytes Relative: 18 %
Lymphs Abs: 2.2 10*3/uL (ref 0.7–4.0)
MCH: 30.9 pg (ref 26.0–34.0)
MCHC: 33.5 g/dL (ref 30.0–36.0)
MCV: 92.2 fL (ref 80.0–100.0)
Monocytes Absolute: 0.9 10*3/uL (ref 0.1–1.0)
Monocytes Relative: 7 %
Neutro Abs: 9.1 10*3/uL — ABNORMAL HIGH (ref 1.7–7.7)
Neutrophils Relative %: 73 %
Platelets: 306 10*3/uL (ref 150–400)
RBC: 3.85 MIL/uL — ABNORMAL LOW (ref 3.87–5.11)
RDW: 13.1 % (ref 11.5–15.5)
WBC: 12.3 10*3/uL — ABNORMAL HIGH (ref 4.0–10.5)
nRBC: 0 % (ref 0.0–0.2)

## 2022-05-11 LAB — COMPREHENSIVE METABOLIC PANEL
ALT: 13 U/L (ref 0–44)
AST: 21 U/L (ref 15–41)
Albumin: 3.3 g/dL — ABNORMAL LOW (ref 3.5–5.0)
Alkaline Phosphatase: 139 U/L — ABNORMAL HIGH (ref 38–126)
Anion gap: 11 (ref 5–15)
BUN: 6 mg/dL (ref 6–20)
CO2: 17 mmol/L — ABNORMAL LOW (ref 22–32)
Calcium: 9.4 mg/dL (ref 8.9–10.3)
Chloride: 108 mmol/L (ref 98–111)
Creatinine, Ser: 0.59 mg/dL (ref 0.44–1.00)
GFR, Estimated: >60 mL/min (ref >60)
Glucose, Bld: 89 mg/dL (ref 70–99)
Potassium: 3.9 mmol/L (ref 3.5–5.1)
Sodium: 136 mmol/L (ref 135–145)
Total Bilirubin: 0.4 mg/dL (ref 0.3–1.2)
Total Protein: 6 g/dL — ABNORMAL LOW (ref 6.5–8.1)

## 2022-05-11 LAB — CBC
HCT: 35.5 % — ABNORMAL LOW (ref 36.0–46.0)
Hemoglobin: 11.7 g/dL — ABNORMAL LOW (ref 12.0–15.0)
MCH: 30.7 pg (ref 26.0–34.0)
MCHC: 33 g/dL (ref 30.0–36.0)
MCV: 93.2 fL (ref 80.0–100.0)
Platelets: 307 10*3/uL (ref 150–400)
RBC: 3.81 MIL/uL — ABNORMAL LOW (ref 3.87–5.11)
RDW: 13.1 % (ref 11.5–15.5)
WBC: 15.4 10*3/uL — ABNORMAL HIGH (ref 4.0–10.5)
nRBC: 0 % (ref 0.0–0.2)

## 2022-05-11 LAB — PROTEIN / CREATININE RATIO, URINE
Creatinine, Urine: 104 mg/dL
Protein Creatinine Ratio: 0.08 mg/mg{Cre} (ref 0.00–0.15)
Total Protein, Urine: 8 mg/dL

## 2022-05-11 MED ORDER — FENTANYL-BUPIVACAINE-NACL 0.5-0.125-0.9 MG/250ML-% EP SOLN
12.0000 mL/h | EPIDURAL | Status: DC | PRN
  Administered 2022-05-11: 12 mL/h via EPIDURAL
  Filled 2022-05-11: qty 250

## 2022-05-11 MED ORDER — ACETAMINOPHEN 325 MG PO TABS: 650.0000 mg | ORAL_TABLET | ORAL | Status: DC | PRN

## 2022-05-11 MED ORDER — NIFEDIPINE ER OSMOTIC RELEASE 30 MG PO TB24
30.0000 mg | ORAL_TABLET | Freq: Every day | ORAL | Status: DC
  Administered 2022-05-11 – 2022-05-13 (×3): 30 mg via ORAL
  Filled 2022-05-11 (×3): qty 1

## 2022-05-11 MED ORDER — DIPHENHYDRAMINE HCL 50 MG/ML IJ SOLN: 12.5000 mg | INTRAMUSCULAR | Status: DC | PRN

## 2022-05-11 MED ORDER — BENZOCAINE-MENTHOL 20-0.5 % EX AERO: 1.0000 | INHALATION_SPRAY | CUTANEOUS | Status: DC | PRN

## 2022-05-11 MED ORDER — TETANUS-DIPHTH-ACELL PERTUSSIS 5-2.5-18.5 LF-MCG/0.5 IM SUSY
0.5000 mL | PREFILLED_SYRINGE | Freq: Once | INTRAMUSCULAR | Status: DC
  Filled 2022-05-11: qty 0.5

## 2022-05-11 MED ORDER — DIBUCAINE (PERIANAL) 1 % EX OINT: 1.0000 | TOPICAL_OINTMENT | CUTANEOUS | Status: DC | PRN

## 2022-05-11 MED ORDER — WITCH HAZEL-GLYCERIN EX PADS: 1.0000 | MEDICATED_PAD | CUTANEOUS | Status: DC | PRN

## 2022-05-11 MED ORDER — SENNOSIDES-DOCUSATE SODIUM 8.6-50 MG PO TABS
2.0000 | ORAL_TABLET | ORAL | Status: DC
  Administered 2022-05-11 – 2022-05-12 (×2): 2 via ORAL
  Filled 2022-05-11 (×2): qty 2

## 2022-05-11 MED ORDER — PHENYLEPHRINE 80 MCG/ML (10ML) SYRINGE FOR IV PUSH (FOR BLOOD PRESSURE SUPPORT): 80.0000 ug | PREFILLED_SYRINGE | INTRAVENOUS | Status: DC | PRN

## 2022-05-11 MED ORDER — COCONUT OIL OIL: 1.0000 | TOPICAL_OIL | Status: DC | PRN

## 2022-05-11 MED ORDER — LACTATED RINGERS IV SOLN: 500.0000 mL | Freq: Once | INTRAVENOUS | Status: DC

## 2022-05-11 MED ORDER — EPHEDRINE 5 MG/ML INJ: 10.0000 mg | INTRAVENOUS | Status: DC | PRN

## 2022-05-11 MED ORDER — PHENYLEPHRINE 80 MCG/ML (10ML) SYRINGE FOR IV PUSH (FOR BLOOD PRESSURE SUPPORT)
80.0000 ug | PREFILLED_SYRINGE | INTRAVENOUS | Status: DC | PRN
  Filled 2022-05-11: qty 10

## 2022-05-11 MED ORDER — DIPHENHYDRAMINE HCL 25 MG PO CAPS: 25.0000 mg | ORAL_CAPSULE | Freq: Four times a day (QID) | ORAL | Status: DC | PRN

## 2022-05-11 MED ORDER — ONDANSETRON HCL 4 MG PO TABS: 4.0000 mg | ORAL_TABLET | ORAL | Status: DC | PRN

## 2022-05-11 MED ORDER — IBUPROFEN 600 MG PO TABS
600.0000 mg | ORAL_TABLET | Freq: Four times a day (QID) | ORAL | Status: DC
  Administered 2022-05-11 – 2022-05-13 (×7): 600 mg via ORAL
  Filled 2022-05-11 (×8): qty 1

## 2022-05-11 MED ORDER — ONDANSETRON HCL 4 MG/2ML IJ SOLN: 4.0000 mg | INTRAMUSCULAR | Status: DC | PRN

## 2022-05-11 MED ORDER — SIMETHICONE 80 MG PO CHEW: 80.0000 mg | CHEWABLE_TABLET | ORAL | Status: DC | PRN

## 2022-05-11 MED ORDER — LABETALOL HCL 100 MG PO TABS
100.0000 mg | ORAL_TABLET | Freq: Two times a day (BID) | ORAL | Status: DC
  Administered 2022-05-11: 100 mg via ORAL
  Filled 2022-05-11: qty 1

## 2022-05-11 MED ORDER — LIDOCAINE HCL (PF) 1 % IJ SOLN
INTRAMUSCULAR | Status: DC | PRN
  Administered 2022-05-11: 3 mL via EPIDURAL
  Administered 2022-05-11: 5 mL via EPIDURAL
  Administered 2022-05-11: 2 mL via EPIDURAL

## 2022-05-11 MED ORDER — FUROSEMIDE 20 MG PO TABS
20.0000 mg | ORAL_TABLET | Freq: Every day | ORAL | Status: DC
  Administered 2022-05-12 – 2022-05-13 (×2): 20 mg via ORAL
  Filled 2022-05-11 (×2): qty 1

## 2022-05-11 MED ORDER — PRENATAL MULTIVITAMIN CH
1.0000 | ORAL_TABLET | Freq: Every day | ORAL | Status: DC
  Administered 2022-05-12 – 2022-05-13 (×2): 1 via ORAL
  Filled 2022-05-11 (×2): qty 1

## 2022-05-11 MED ORDER — ZOLPIDEM TARTRATE 5 MG PO TABS: 5.0000 mg | ORAL_TABLET | Freq: Every evening | ORAL | Status: DC | PRN

## 2022-05-11 MED ORDER — OXYTOCIN-SODIUM CHLORIDE 30-0.9 UT/500ML-% IV SOLN
1.0000 m[IU]/min | INTRAVENOUS | Status: DC
  Administered 2022-05-11: 2 m[IU]/min via INTRAVENOUS
  Filled 2022-05-11: qty 500

## 2022-05-11 NOTE — Plan of Care (Signed)

## 2022-05-11 NOTE — Lactation Note (Signed)
This note was copied from a baby's chart. Lactation Consultation Note  Patient Name: Laura Andersen [id number] Date: 05/11/2022 Reason for consult: Initial assessment;Early term 37-38.6wks;Breastfeeding assistance Age:33 hours  LC entered the room and the infant was asleep in the bassinet.  Per the birth parent, she had issues getting her older two children to latch.  The birth parent stated that she would like to be set up with a DEBP just in case she needed it.  LC set the birth parent up with the DEBP. LC educated the birth parent on milk production, feeding cues, milk storage, cluster feeding, pumping frequency, and washing pump parts.  The infant began showing feeding cues.  LC assisted the birth parent with latching the infant to the left breast in the cross-cradle position.  The infant latched deeply with lips flanged, sucking was rhythmic, jaw extensions were noted.  When assessing the infant's oral cavity, the LC noted that the infant may have a thin lingual frenulum and a thick labial frenulum.  The infant's tongue is able to extend past the gumline.  There is some clicking when the infant is on the breast, but the parent stated that she does not feel any pain when the infant is at the breast.  LC spoke with the birth parent about outpatient LC services.  All questions were answered.  The birth parent will call the RN/LC for assistance with breastfeeding.   Infant Feeding Plan:  Breastfeed 8+ times in 24 hours according to feeding cues.  Put the infant to the breast prior to supplementing.  Supplement according to supplementation guidelines.  Call RN/LC for assistance with breastfeeding.   Maternal Data Has patient been taught Hand Expression?: Yes Does the patient have breastfeeding experience prior to this delivery?: Yes How long did the patient breastfeed?: She tried with her first and he did not latch & she tried for 2 weeks with the 2nd and he did not  latch  Feeding Mother's Current Feeding Choice: Breast Milk and Formula  LATCH Score Latch: Grasps breast easily, tongue down, lips flanged, rhythmical sucking.  Audible Swallowing: Spontaneous and intermittent  Type of Nipple: Everted at rest and after stimulation  Comfort (Breast/Nipple): Soft / non-tender  Hold (Positioning): Assistance needed to correctly position infant at breast and maintain latch.  LATCH Score: 9   Lactation Tools Discussed/Used Tools: Pump Breast pump type: Double-Electric Breast Pump Pump Education: Setup, frequency, and cleaning;Milk Storage Reason for Pumping: Birth parent's request Pumping frequency: PRN  Interventions Interventions: Breast feeding basics reviewed;Assisted with latch;Breast massage;Hand express;Adjust position;Support pillows;DEBP;Education;LC Services brochure  Discharge Pump: DEBP;Personal (Motif Doy Mince)  Consult Status Consult Status: Follow-up Date: 05/12/22 Follow-up type: In-patient    Delene Loll 05/11/2022, 6:27 PM

## 2022-05-11 NOTE — Discharge Summary (Signed)
Postpartum Discharge Summary   Patient Name: Laura Andersen DOB: Oct 15, 1990 MRN: [id number]  Date of admission: 05/10/2022 Delivery date:05/11/2022  Delivering provider: Concepcion Living  Date of discharge: 05/13/2022  Admitting diagnosis: Chronic hypertension affecting pregnancy [O10.919] Intrauterine pregnancy: 104w1d    Secondary diagnosis:  Principal Problem:   Chronic hypertension affecting pregnancy Active Problems:   Herpes simplex   Essential (primary) hypertension   Supervision of high-risk pregnancy   Vaginal delivery  Additional problems: None    Discharge diagnosis: Term Pregnancy Delivered and CHTN                                              Post partum procedures: none Augmentation: AROM, Pitocin, Cytotec, and IP Foley Complications: None  Hospital course: Induction of Labor With Vaginal Delivery   31y.o. yo [id number] 325w1das admitted to the hospital 05/10/2022 for induction of labor.  Indication for induction:  cHTN .  Patient had an uncomplicated labor course Membrane Rupture Time/Date: 11:05 AM ,05/11/2022   Delivery Method:Vaginal, Spontaneous  Episiotomy: None  Lacerations:  None  Details of delivery can be found in separate delivery note.  Patient had a postpartum course complicated by none. Her blood pressures are well controlled on current medication regimen. Patient is discharged home 05/13/22.  Newborn Data: Birth date:05/11/2022  Birth time:1:16 PM  Gender:Female  Living status:Living  Apgars:8 ,9  Weight:3210 g   Magnesium Sulfate received: No BMZ received: No Rhophylac:N/A MMR:No T-DaP:Given prenatally Flu: N/A Transfusion:No  Physical exam  Vitals:   05/12/22 0514 05/12/22 1448 05/12/22 2153 05/13/22 0504  BP: 110/65 117/62 138/86 127/76  Pulse: 89 92 99 99  Resp: _0 Temp: 98.3 F (36.8 C) 98.2 F (36.8 C) 98.2 F (36.8 C) 98.4 F (36.9 C)  TempSrc: Oral Oral Axillary Axillary  SpO2:  98%    Weight:       Height:       General: alert, cooperative, and no distress Lochia: appropriate Uterine Fundus: firm Incision: N/A DVT Evaluation: No evidence of DVT seen on physical exam. Labs: Lab Results  Component Value Date   WBC 15.4 (H) 05/11/2022   HGB 11.7 (L) 05/11/2022   HCT 35.5 (L) 05/11/2022   MCV 93.2 05/11/2022   PLT 307 05/11/2022      Latest Ref Rng & Units 05/11/2022    1:34 AM  CMP  Glucose 70 - 99 mg/dL 89   BUN 6 - 20 mg/dL 6   Creatinine 0.44 - 1.00 mg/dL 0.59   Sodium 135 - 145 mmol/L 136   Potassium 3.5 - 5.1 mmol/L 3.9   Chloride 98 - 111 mmol/L 108   CO2 22 - 32 mmol/L 17   Calcium 8.9 - 10.3 mg/dL 9.4   Total Protein 6.5 - 8.1 g/dL 6.0   Total Bilirubin 0.3 - 1.2 mg/dL 0.4   Alkaline Phos 38 - 126 U/L 139   AST 15 - 41 U/L 21   ALT 0 - 44 U/L 13    Edinburgh Score:    05/11/2022    4:07 PM  Edinburgh Postnatal Depression Scale Screening Tool  I have been able to laugh and see the funny side of things. 0  I have looked forward with enjoyment to things. 0  I have blamed myself unnecessarily when things went wrong. 0  I have been anxious or worried for no good reason. 0  I have felt scared or panicky for no good reason. 0  Things have been getting on top of me. 0  I have been so unhappy that I have had difficulty sleeping. 0  I have felt sad or miserable. 0  I have been so unhappy that I have been crying. 0  The thought of harming myself has occurred to me. 0  Edinburgh Postnatal Depression Scale Total 0     After visit meds:  Allergies as of 05/13/2022       Reactions   Amoxicillin Itching   Benadryl [diphenhydramine] Swelling        Medication List     STOP taking these medications    aspirin EC 81 MG tablet   labetalol 100 MG tablet Commonly known as: NORMODYNE   omeprazole 20 MG capsule Commonly known as: PRILOSEC   valACYclovir 1000 MG tablet Commonly known as: Valtrex       TAKE these medications    Blood Pressure  Monitor Misc For regular home bp monitoring during pregnancy   furosemide 20 MG tablet Commonly known as: LASIX Take 1 tablet (20 mg total) by mouth daily for 3 days. Start taking on: May 14, 2022   loratadine 10 MG tablet Commonly known as: CLARITIN Take 1 tablet (10 mg total) by mouth daily as needed for allergies or itching.   NIFEdipine 30 MG 24 hr tablet Commonly known as: ADALAT CC Take 1 tablet (30 mg total) by mouth daily.   PRENATAL VITAMIN PO Take by mouth.         Discharge home in stable condition Infant Feeding: Breast Infant Disposition:home with mother Discharge instruction: per After Visit Summary and Postpartum booklet. Activity: Advance as tolerated. Pelvic rest for 6 weeks.  Diet: routine diet Future Appointments: Future Appointments  Date Time Provider [place]  05/18/2022 11:50 AM [facility] NURSE [facility]  06/22/2022  1:30 PM Myrtis Ser, CNM [facility]  09/30/2022 11:00 AM Lindell Spar, MD [facility]   Follow up Visit: as scheduled above.  Liliane Channel MD MPH OB Fellow, [place] [facility] [place] 05/13/2022

## 2022-05-11 NOTE — Progress Notes (Signed)
Patient ID: Laura Andersen, female   DOB: 1990/07/08, 31 y.o.   MRN: [id number] Laura Andersen is a 31 y.o. [id number] at [redacted]w[redacted]d admitted for induction of labor due to chronic hypertension   Called by RN, foley out, unable to reach cx  Subjective: beginning to get uncomfortable w/ contractions and denies headache, visual changes, ruq/epigastric pain, n/v  Objective: BP (!) 143/81   Pulse 87   Temp 98.2 F (36.8 C) (Oral)   Resp 16   Ht 5\' 6"  (1.676 m)   Wt 105.9 kg   LMP 07/17/2021 (Exact Date)   BMI 37.69 kg/m  No intake/output data recorded.  FHR baseline 140 bpm, Variability: moderate, Accelerations:present, Decelerations:  Absent Toco: q 2-5 mins   SVE:   4.5/80/-3, vtx  Labs: Lab Results  Component Value Date   WBC 9.6 05/10/2022   HGB 11.8 (L) 05/10/2022   HCT 34.8 (L) 05/10/2022   MCV 91.6 05/10/2022   PLT 321 05/10/2022    Assessment / Plan: IOL d/t chronic hypertension , s/p foley bulb (out at 0020) and s/p cytotec x 1 (50/25)- last dose at 2050. Pitocin: will begin per protocol.   Labor: s/p cervical ripening Fetal Wellbeing:  Category I Pain Control:  labor support without medications Pre-eclampsia:  CHTN, bp's stable I/D:  Ancef for GBS+ Anticipated MOD: NSVB  05/12/2022 CNM, WHNP-BC 05/11/2022, 12:34 AM

## 2022-05-11 NOTE — Lactation Note (Signed)
This note was copied from a baby's chart. Lactation Consultation Note  Patient Name: Laura Andersen [id number] Date: 05/11/2022 Reason for consult: L&D Initial assessment;Early term 37-38.6wks;Breastfeeding assistance Age:33 hours  LC entered the room and the infant was STS with the birth parent.  Per the birth parent, she tried to breastfeed with her other two children.  She said that she would like to try with this infant as well.  LC assisted the birth parent with putting the infant to the breast in the cross-cradle position on the left breast.  The infant latched deeply with lips flanged, sucking was rhythmic, and some jaw extensions were noted.  The infant stayed on the breast for 3 min and had trouble staying on due to being stuffy.  The infant sneezed twice and came off the breast.  LC placed the infant STS with the birth parent per her request.  The birth parent is aware that she will be seen on the [facility].   Maternal Data Does the patient have breastfeeding experience prior to this delivery?: Yes How long did the patient breastfeed?: She tried with her 1st two children  Feeding Mother's Current Feeding Choice: Breast Milk and Formula  LATCH Score Latch: Grasps breast easily, tongue down, lips flanged, rhythmical sucking.  Audible Swallowing: A few with stimulation  Type of Nipple: Everted at rest and after stimulation  Comfort (Breast/Nipple): Soft / non-tender  Hold (Positioning): Assistance needed to correctly position infant at breast and maintain latch.  LATCH Score: 8   Lactation Tools Discussed/Used    Interventions Interventions: Assisted with latch;Adjust position  Discharge    Consult Status Consult Status: Follow-up from L&D Date: 05/11/22 Follow-up type: In-patient    Orvil Feil Hazelgrace Bonham 05/11/2022, 2:01 PM

## 2022-05-11 NOTE — Anesthesia Procedure Notes (Signed)
Epidural Patient location during procedure: OB Start time: 05/11/2022 3:51 AM End time: 05/11/2022 3:58 AM  Staffing Anesthesiologist: Marcene Duos, MD Performed: anesthesiologist   Preanesthetic Checklist Completed: patient identified, IV checked, site marked, risks and benefits discussed, surgical consent, monitors and equipment checked, pre-op evaluation and timeout performed  Epidural Patient position: sitting Prep: DuraPrep and site prepped and draped Patient monitoring: continuous pulse ox and blood pressure Approach: midline Location: L4-L5 Injection technique: LOR air  Needle:  Needle type: Tuohy  Needle gauge: 17 G Needle length: 9 cm and 9 Needle insertion depth: 7 cm Catheter type: closed end flexible Catheter size: 19 Gauge Catheter at skin depth: 12 cm Test dose: negative  Assessment Events: blood not aspirated, no cerebrospinal fluid, injection not painful, no injection resistance, no paresthesia and negative IV test

## 2022-05-11 NOTE — Progress Notes (Signed)
Patient ID: Laura Andersen, female   DOB: [birth year], 31 y.o.   MRN: [id number] Laura Andersen is a 31 y.o. [id number] at [redacted]w[redacted]d admitted for induction of labor due to chronic hypertension   Subjective: comfortable with epidural, denies ha, visual changes, ruq/epigastric pain, n/v.    Objective: BP (!) 141/65   Pulse (!) 112   Temp 98.3 F (36.8 C) (Oral)   Resp 16   Ht 5\' 6"  (1.676 m)   Wt 105.9 kg   LMP 07/17/2021 (Exact Date)   SpO2 99%   BMI 37.69 kg/m  No intake/output data recorded.  FHR baseline 140 bpm, Variability: moderate, Accelerations:present, Decelerations:  Absent Toco: q 1-3 mins   SVE:   Dilation: 5.5 Effacement (%): 80 Station: -3 Exam by:: [email or web address], RN  Pitocin @ 6 mu/min  Labs: Lab Results  Component Value Date   WBC 12.3 (H) 05/11/2022   HGB 11.9 (L) 05/11/2022   HCT 35.5 (L) 05/11/2022   MCV 92.2 05/11/2022   PLT 306 05/11/2022    Assessment / Plan: IOL d/t CHTN , s/p cyto x 1 (50/25), foley bulb (out at 0020), pit @ 6, making cervical change. Vtx still too high to AROM. Has epidural. Plan AROM when able. BP's have been elevated, was well controlled on labetalol 100mg  BID during pregnancy- will add this back and get pre-e labs.   Labor: early Fetal Wellbeing:  Category I Pain Control:  epidural Pre-eclampsia: asymptomatic, bp's stable, and will get labs I/D:  Ancef for GBS+ Anticipated MOD: NSVB  05/13/2022 CNM, WHNP-BC 05/11/2022, [phone]

## 2022-05-11 NOTE — Anesthesia Preprocedure Evaluation (Signed)
Anesthesia Evaluation  Patient identified by MRN, date of birth, ID band Patient awake    Reviewed: Allergy & Precautions, Patient's Chart, lab work & pertinent test results  Airway Mallampati: II  TM Distance: >3 FB     Dental   Pulmonary former smoker   Pulmonary exam normal        Cardiovascular hypertension, Pt. on home beta blockers  Rhythm:Regular Rate:Normal     Neuro/Psych negative neurological ROS     GI/Hepatic negative GI ROS, Neg liver ROS,,,  Endo/Other  negative endocrine ROS    Renal/GU Renal disease     Musculoskeletal   Abdominal   Peds  Hematology negative hematology ROS (+)   Anesthesia Other Findings   Reproductive/Obstetrics (+) Pregnancy                             Anesthesia Physical Anesthesia Plan  ASA: 2  Anesthesia Plan: Epidural   Post-op Pain Management:    Induction:   PONV Risk Score and Plan: 2 and Treatment may vary due to age or medical condition  Airway Management Planned: Natural Airway  Additional Equipment:   Intra-op Plan:   Post-operative Plan:   Informed Consent: I have reviewed the patients History and Physical, chart, labs and discussed the procedure including the risks, benefits and alternatives for the proposed anesthesia with the patient or authorized representative who has indicated his/her understanding and acceptance.       Plan Discussed with:   Anesthesia Plan Comments:        Anesthesia Quick Evaluation

## 2022-05-12 NOTE — Progress Notes (Signed)
POSTPARTUM PROGRESS NOTE  Post Partum Day 1  Subjective:  Laura Andersen is a 31 y.o. [id number] s/p VD at [redacted]w[redacted]d.  She reports she is doing well. No acute events overnight. She denies any problems with ambulating, voiding or po intake. Denies nausea or vomiting.  Pain is well controlled.  Lochia is minimal.  Objective: Blood pressure 117/62, pulse 92, temperature 98.2 F (36.8 C), temperature source Oral, resp. rate 16, height 5\' 6"  (1.676 m), weight 105.9 kg, last menstrual period 07/17/2021, SpO2 98 %, unknown if currently breastfeeding.  Physical Exam:  General: alert, cooperative and no distress Chest: no respiratory distress Heart:regular rate, distal pulses intact Abdomen: soft, nontender,  Uterine Fundus: firm, appropriately tender DVT Evaluation: No calf swelling or tenderness Extremities: no edema Skin: warm, dry  Recent Labs    05/11/22 0134 05/11/22 1342  HGB 11.9* 11.7*  HCT 35.5* 35.5*    Assessment/Plan: DYANNE YORKS is a 31 y.o. 32 s/p VD at [redacted]w[redacted]d   PPD#1 - Doing well  Routine postpartum care cHTN - blood pressures well controlled on procardia XL 30mg , and lasix 20mg  Contraception: IUD outpatient Feeding: breast Dispo: Plan for discharge tomorrow.   LOS: 2 days   [redacted]w[redacted]d MD MPH OB Fellow, [facility], Center for [facility] 05/12/2022

## 2022-05-12 NOTE — Anesthesia Postprocedure Evaluation (Signed)
Anesthesia Post Note  Patient: Laura Andersen  Procedure(s) Performed: AN AD HOC LABOR EPIDURAL     Patient location during evaluation: Mother Baby Anesthesia Type: Epidural Level of consciousness: awake and alert Pain management: pain level controlled Vital Signs Assessment: post-procedure vital signs reviewed and stable Respiratory status: spontaneous breathing, nonlabored ventilation and respiratory function stable Cardiovascular status: stable Postop Assessment: no headache, no backache and epidural receding Anesthetic complications: no   No notable events documented.  Last Vitals:  Vitals:   05/11/22 2000 05/12/22 0514  BP: 124/71 110/65  Pulse: 90 89  Resp: 17 16  Temp: 36.6 C 36.8 C  SpO2:      Last Pain:  Vitals:   05/12/22 0514  TempSrc: Oral  PainSc:    Pain Goal:                   Rufina Kimery

## 2022-05-12 NOTE — Plan of Care (Signed)

## 2022-05-13 ENCOUNTER — Other Ambulatory Visit (HOSPITAL_COMMUNITY): Payer: Self-pay

## 2022-05-13 ENCOUNTER — Other Ambulatory Visit: Payer: Medicaid Other

## 2022-05-13 LAB — SYPHILIS: RPR W/REFLEX TO RPR TITER AND TREPONEMAL ANTIBODIES, TRADITIONAL SCREENING AND DIAGNOSIS ALGORITHM: RPR Ser Ql: NONREACTIVE

## 2022-05-13 MED ORDER — FUROSEMIDE 20 MG PO TABS
20.0000 mg | ORAL_TABLET | Freq: Every day | ORAL | 0 refills | Status: DC
  Filled 2022-05-13: qty 3, 3d supply, fill #0

## 2022-05-13 MED ORDER — NIFEDIPINE ER 30 MG PO TB24
30.0000 mg | ORAL_TABLET | Freq: Every day | ORAL | 0 refills | Status: DC
  Filled 2022-05-13: qty 30, 30d supply, fill #0

## 2022-05-13 NOTE — Discharge Instructions (Signed)
-   Continue your prenatal vitamins especially if breastfeeding - Try to eat iron rich food. - Take over the counter tylenol (500mg) or ibuprofen (200mg) three times a day as needed for cramping/pain. - continue blood pressure medication. - Take water pill (lasix) as prescribed for a total of 5 days. - follow up in clinic in 1 week for a blood pressure check and in 4-6 weeks as scheduled for your regular post partum visit. - Please come back to [facility] if you notice persistently elevated blood pressures or you start to have a headache, that doesn't get better with medications (tylenol and ibuprofen), rest (4hrs of sleep) and drinking water.  

## 2022-05-13 NOTE — Lactation Note (Signed)
This note was copied from a baby's chart. Lactation Consultation Note  Patient Name: Laura Andersen Date: 05/13/2022 Reason for consult: Follow-up assessment;Early term 37-38.6wks (per mom thinking she has decided to change to formula, may pump when she gets home. LC discussed BF options. LC reviewed BF D/C teaching and LC resources.) Age:33 hours  Maternal Data    Feeding Mother's Current Feeding Choice: Formula Nipple Type: Slow - flow    Lactation Tools Discussed/Used Tools: Pump (per mom has not pumped since it was set up) Breast pump type: Double-Electric Breast Pump  Interventions Interventions: Education  Discharge Discharge Education: Engorgement and breast care;Outpatient recommendation Pump: Personal  Consult Status Consult Status: Complete Date: 05/13/22    Kathrin Greathouse 05/13/2022, 8:44 AM

## 2022-05-17 ENCOUNTER — Encounter: Payer: Medicaid Other | Admitting: Obstetrics & Gynecology

## 2022-05-17 ENCOUNTER — Other Ambulatory Visit: Payer: Medicaid Other

## 2022-05-18 ENCOUNTER — Ambulatory Visit (INDEPENDENT_AMBULATORY_CARE_PROVIDER_SITE_OTHER): Payer: Medicaid Other | Admitting: *Deleted

## 2022-05-18 ENCOUNTER — Encounter: Payer: Self-pay | Admitting: *Deleted

## 2022-05-18 VITALS — BP 127/81 | HR 108

## 2022-05-18 DIAGNOSIS — I10 Essential (primary) hypertension: Secondary | ICD-10-CM

## 2022-05-18 NOTE — Progress Notes (Signed)
   NURSE VISIT- BLOOD PRESSURE CHECK  SUBJECTIVE:  Laura Andersen is a 31 y.o. [phone] female here for BP check. She is postpartum, delivery date 05/11/22     HYPERTENSION ROS:  Postpartum:  Severe headaches that don't go away with tylenol/other medicines: No  Visual changes (seeing spots/double/blurred vision) No  Severe pain under right breast breast or in center of upper chest No  Severe nausea/vomiting No  Taking medicines as instructed yes  OBJECTIVE:  BP 127/81 (BP Location: Left Arm, Patient Position: Sitting, Cuff Size: Normal)   Pulse (!) 108   Breastfeeding Yes   Appearance alert, well appearing, and in no distress and oriented to person, place, and time.  ASSESSMENT: Postpartum  blood pressure check  PLAN: Discussed with Dr. Charlotta Newton   Recommendations: no changes needed   Follow-up: as scheduled   [name]  05/18/2022 12:13 PM

## 2022-05-20 ENCOUNTER — Other Ambulatory Visit: Payer: Medicaid Other

## 2022-05-24 ENCOUNTER — Encounter: Payer: Self-pay | Admitting: *Deleted

## 2022-06-07 DIAGNOSIS — H5213 Myopia, bilateral: Secondary | ICD-10-CM | POA: Diagnosis not present

## 2022-06-22 ENCOUNTER — Encounter: Payer: Self-pay | Admitting: Advanced Practice Midwife

## 2022-06-22 ENCOUNTER — Ambulatory Visit (INDEPENDENT_AMBULATORY_CARE_PROVIDER_SITE_OTHER): Payer: Medicaid Other | Admitting: Advanced Practice Midwife

## 2022-06-22 DIAGNOSIS — Z3202 Encounter for pregnancy test, result negative: Secondary | ICD-10-CM | POA: Diagnosis not present

## 2022-06-22 DIAGNOSIS — Z30430 Encounter for insertion of intrauterine contraceptive device: Secondary | ICD-10-CM | POA: Insufficient documentation

## 2022-06-22 LAB — POCT URINE PREGNANCY: Preg Test, Ur: NEGATIVE

## 2022-06-22 MED ORDER — LEVONORGESTREL 20 MCG/DAY IU IUD
1.0000 | INTRAUTERINE_SYSTEM | Freq: Once | INTRAUTERINE | Status: AC
  Administered 2022-06-22: 1 via INTRAUTERINE

## 2022-06-22 MED ORDER — VALACYCLOVIR HCL 1 G PO TABS: 1000.0000 mg | ORAL_TABLET | Freq: Every day | ORAL | 6 refills | Status: DC

## 2022-06-22 MED ORDER — NIFEDIPINE ER 30 MG PO TB24: 30.0000 mg | ORAL_TABLET | Freq: Every day | ORAL | 4 refills | Status: DC

## 2022-06-22 NOTE — Progress Notes (Signed)
POSTPARTUM VISIT Patient name: Laura Andersen MRN [id number]  Date of birth: Oct 05, 1990 Chief Complaint:   Postpartum Care  History of Present Illness:   Laura Andersen is a 32 y.o. [phone] African American female being seen today for a postpartum visit. She is 6 weeks postpartum following a spontaneous vaginal delivery at 38.1 gestational weeks. IOL: yes, for chronic hypertension . Anesthesia: epidural.  Laceration: none.  Complications: none. Inpatient contraception: no.   Pregnancy complicated by cHTN; hx HSV . Tobacco use: former . Substance use disorder: no. Last pap smear: Sept 2021 and results were NILM w/ HRHPV negative. Next pap smear due: Sept 2024 No LMP recorded.  Postpartum course has been uncomplicated. Bleeding scant staining. Bowel function is normal. Bladder function is normal. Urinary incontinence? no, fecal incontinence? no Patient is not sexually active. Last sexual activity: prior to birth of baby. Desired contraception: IUD. Patient does not know about a pregnancy in the future.  Desired family size is unsure number of children.   Upstream - 06/22/22 1335       Pregnancy Intention Screening   Does the patient want to become pregnant in the next year? No    Does the patient's partner want to become pregnant in the next year? No    Would the patient like to discuss contraceptive options today? Yes      Contraception Wrap Up   Current Method Abstinence    End Method IUD or IUS    Contraception Counseling Provided Yes            The pregnancy intention screening data noted above was reviewed. Potential methods of contraception were discussed. The patient elected to proceed with IUD or IUS.  Edinburgh Postpartum Depression Screening: negative  Edinburgh Postnatal Depression Scale - 06/22/22 1339       Edinburgh Postnatal Depression Scale:  In the Past 7 Days   I have been able to laugh and see the funny side of things. 0    I have looked forward  with enjoyment to things. 0    I have blamed myself unnecessarily when things went wrong. 0    I have been anxious or worried for no good reason. 0    I have felt scared or panicky for no good reason. 0    Things have been getting on top of me. 1    I have been so unhappy that I have had difficulty sleeping. 0    I have felt sad or miserable. 0    I have been so unhappy that I have been crying. 0    The thought of harming myself has occurred to me. 0    Edinburgh Postnatal Depression Scale Total 1                02/23/2022    9:24 AM 11/29/2021    9:54 AM 02/19/2020    1:40 PM  GAD 7 : Generalized Anxiety Score  Nervous, Anxious, on Edge 0 0 0  Control/stop worrying 0 0 0  Worry too much - different things 0 0 0  Trouble relaxing 2 1 0  Restless 0 0 0  Easily annoyed or irritable 2 1 0  Afraid - awful might happen 0 0 0  Total GAD 7 Score 4 2 0  Anxiety Difficulty   Not difficult at all     Baby's course has been uncomplicated. Baby is feeding by bottle. Infant has a pediatrician/family doctor? Yes.  Childcare strategy if  returning to work/school: n/a-working from home.  Pt has material needs met for her and baby: Yes.   Review of Systems:   Pertinent items are noted in HPI Denies Abnormal vaginal discharge w/ itching/odor/irritation, headaches, visual changes, shortness of breath, chest pain, abdominal pain, severe nausea/vomiting, or problems with urination or bowel movements. Pertinent History Reviewed:  Reviewed past medical,surgical, obstetrical and family history.  Reviewed problem list, medications and allergies. OB History  Gravida Para Term Preterm AB Living  5 3 3   2 3   SAB IAB Ectopic Multiple Live Births  1     0 3    # Outcome Date GA Lbr Len/2nd Weight Sex Delivery Anes PTL Lv  5 Term 05/11/22 [redacted]w[redacted]d 02:04 / 00:07 7 lb 1.2 oz (3.21 kg) M Vag-Spont EPI  LIV  4 Term 08/21/10 [redacted]w[redacted]d  7 lb 8 oz (3.402 kg) M Vag-Spont EPI N LIV  3 AB 2011          2 Term  05/14/06 [redacted]w[redacted]d  6 lb 9 oz (2.977 kg) M Vag-Spont EPI N LIV  1 SAB 2005           Physical Assessment:   Vitals:   06/22/22 1341 06/22/22 1346  BP: (!) 156/100 (!) 143/100  Pulse: 98   Weight: 209 lb (94.8 kg)   Body mass index is 33.73 kg/m.       Physical Examination:   General appearance: alert, well appearing, and in no distress  Mental status: alert, oriented to person, place, and time  Skin: warm & dry   Cardiovascular: normal heart rate noted   Respiratory: normal respiratory effort, no distress   Breasts: deferred, no complaints   Abdomen: soft, non-tender   Pelvic: normal external genitalia, vulva, vagina, cervix, uterus and adnexa. Thin prep pap obtained: No  Rectal: not examined  Extremities: Edema: none        Results for orders placed or performed in visit on 06/22/22 (from the past 24 hour(s))  POCT urine pregnancy   Collection Time: 06/22/22  1:50 PM  Result Value Ref Range   Preg Test, Ur Negative Negative     IUD INSERTION The risks and benefits of the method and placement have been thouroughly reviewed with the patient and all questions were answered.  Specifically the patient is aware of failure rate of 05/998, expulsion of the IUD and of possible perforation.  The patient is aware of irregular bleeding due to the method and understands the incidence of irregular bleeding diminishes with time.  Signed copy of informed consent in chart.   Time out was performed.  A Graves speculum was placed in the vagina.  The cervix was visualized, prepped using Betadine, and grasped with a single tooth tenaculum. The uterus was found to be retroflexed and it sounded to 7 cm.  Mirena  IUD placed per manufacturer's recommendations. The strings were trimmed to approximately 3 cm. The patient tolerated the procedure well.   Informal transvaginal sonogram was performed and the proper placement of the IUD was verified.  Chaperone: Futures trader & Plan:  1)  Postpartum exam 2) Six wks s/p spontaneous vaginal delivery 3) bottle feeding 4) Depression screening: negative 5) Mirena IUD insertion The patient was given post procedure instructions, including signs and symptoms of infection and to check for the strings after each menses or each month, and refraining from intercourse or anything in the vagina for 3 days.  Condoms for 2 weeks. She was  given a care card with date IUD placed, and date IUD to be removed.  She is scheduled for a f/u appointment in 4 weeks.  6) cHTN, ran out of ProcardiaXL 30mg  a few days ago so BPs were up a little today; refilled  7) Hx HSV, refilled Valtrex for prn use to have on hand for outbreaks  Essential components of care per ACOG recommendations:  1.  Mood and well being:  If positive depression screen, discussed and plan developed.  If using tobacco we discussed reduction/cessation and risk of relapse If current substance abuse, we discussed and referral to local resources was offered.   2. Infant care and feeding:  If breastfeeding, discussed returning to work, pumping, breastfeeding-associated pain, guidance regarding return to fertility while lactating if not using another method. If needed, patient was provided with a letter to be allowed to pump q 2-3hrs to support lactation in a private location with access to a refrigerator to store breastmilk.   Recommended that all caregivers be immunized for flu, pertussis and other preventable communicable diseases If pt does not have material needs met for her/baby, referred to local resources for help obtaining these.  3. Sexuality, contraception and birth spacing Provided guidance regarding sexuality, management of dyspareunia, and resumption of intercourse Discussed avoiding interpregnancy interval <16mths and recommended birth spacing of 18 months  4. Sleep and fatigue Discussed coping options for fatigue and sleep disruption Encouraged family/partner/community  support of 4 hrs of uninterrupted sleep to help with mood and fatigue  5. Physical recovery  If pt had a C/S, assessed incisional pain and providing guidance on normal vs prolonged recovery If pt had a laceration, perineal healing and pain reviewed.  If urinary or fecal incontinence, discussed management and referred to PT or uro/gyn if indicated  Patient is safe to resume physical activity. Discussed attainment of healthy weight.  6.  Chronic disease management Discussed pregnancy complications if any, and their implications for future childbearing and long-term maternal health. Review recommendations for prevention of recurrent pregnancy complications, such as 17 hydroxyprogesterone caproate to reduce risk for recurrent PTB not applicable, or aspirin to reduce risk of preeclampsia yes. Pt had GDM: No. If yes, 2hr GTT scheduled: not applicable. Reviewed medications and non-pregnant dosing including consideration of whether pt is breastfeeding using a reliable resource such as LactMed: not applicable Referred for f/u w/ PCP or subspecialist providers as indicated: not applicable (sees Dr Posey Pronto)  7. Health maintenance Mammogram at 34yo or earlier if indicated Pap smears as indicated  Meds:  Meds ordered this encounter  Medications   NIFEdipine (ADALAT CC) 30 MG 24 hr tablet    Sig: Take 1 tablet (30 mg total) by mouth daily.    Dispense:  90 tablet    Refill:  4    Order Specific Question:   Supervising Provider    Answer:   Janyth Pupa [[id number]]   valACYclovir (VALTREX) 1000 MG tablet    Sig: Take 1 tablet (1,000 mg total) by mouth daily.    Dispense:  30 tablet    Refill:  6    Order Specific Question:   Supervising Provider    Answer:   Janyth Pupa [[id number]]    Follow-up: Return in about 4 weeks (around 07/20/2022) for IUD f/u.   Orders Placed This Encounter  Procedures   POCT urine pregnancy    Myrtis Ser [facility] 06/22/2022 2:09 PM

## 2022-06-22 NOTE — Addendum Note (Signed)
Addended by: Octaviano Glow on: 06/22/2022 02:39 PM   Modules accepted: Orders

## 2022-06-22 NOTE — Patient Instructions (Signed)
Nothing in vagina for 3 days (no sex, douching, tampons, etc...) Check your strings once a month to make sure you can feel them, if you are not able to please let us know If you develop a fever of 100.4 or more in the next few weeks, or if you develop severe abdominal pain, please let us know Use a backup method of birth control, such as condoms, for 2 weeks  

## 2022-07-04 ENCOUNTER — Ambulatory Visit (INDEPENDENT_AMBULATORY_CARE_PROVIDER_SITE_OTHER): Payer: Medicaid Other | Admitting: Internal Medicine

## 2022-07-04 ENCOUNTER — Encounter: Payer: Self-pay | Admitting: Internal Medicine

## 2022-07-04 VITALS — BP 156/102 | HR 83 | Ht 66.0 in | Wt 210.8 lb

## 2022-07-04 DIAGNOSIS — Z020 Encounter for examination for admission to educational institution: Secondary | ICD-10-CM | POA: Diagnosis not present

## 2022-07-04 DIAGNOSIS — I10 Essential (primary) hypertension: Secondary | ICD-10-CM

## 2022-07-04 DIAGNOSIS — Z111 Encounter for screening for respiratory tuberculosis: Secondary | ICD-10-CM

## 2022-07-04 DIAGNOSIS — E669 Obesity, unspecified: Secondary | ICD-10-CM

## 2022-07-04 DIAGNOSIS — E782 Mixed hyperlipidemia: Secondary | ICD-10-CM

## 2022-07-04 MED ORDER — OLMESARTAN MEDOXOMIL 20 MG PO TABS: 20.0000 mg | ORAL_TABLET | Freq: Every day | ORAL | 3 refills | Status: DC

## 2022-07-04 NOTE — Patient Instructions (Signed)
Please start taking Olmesartan as prescribed.  Please follow DASH diet and perform moderate exercise/walking at least 150 mins/week.

## 2022-07-06 DIAGNOSIS — Z020 Encounter for examination for admission to educational institution: Secondary | ICD-10-CM | POA: Insufficient documentation

## 2022-07-06 LAB — TB SKIN TEST
Induration: NEGATIVE mm
TB Skin Test: NEGATIVE

## 2022-07-06 NOTE — Assessment & Plan Note (Signed)
BP Readings from Last 1 Encounters:  07/04/22 (!) 156/102   Uncontrolled with Nifedipine currently Recent higher BP could be due to volume expansion during pregnancy Added Olmesartan 20 mg QD Check BMP after 2 weeks Counseled for compliance with the medications Advised DASH diet and moderate exercise/walking, at least 150 mins/week Advised to check BP at home and bring the log in the next visit

## 2022-07-06 NOTE — Assessment & Plan Note (Signed)
Diet modification and moderate exercise advised. 

## 2022-07-06 NOTE — Progress Notes (Signed)
Established Patient Office Visit  Subjective:  Patient ID: Laura Andersen, female    DOB: 1990/11/21  Age: 32 y.o. MRN: [id number]  CC:  Chief Complaint  Patient presents with   Hypertension    Follow up. Patient is needing tb test done     HPI Laura Andersen is a 32 y.o. female with past medical history of HTN who presents for f/u of her chronic medical conditions and preschool exam.  HTN: She was on labetalol for HTN during her pregnancy. She was placed on Nifedipine by her Ob.Gyn. after pregnancy. Her BP was elevated today.  She denies any headache, dizziness, chest pain, dyspnea or palpitations currently.  Denies any leg swelling.  She is not breast-feeding currently.  She has brought paper work from her school and she is planning to start [place].  She requires PPD testing and MMR, hep B and varicella vaccine titers.  Past Medical History:  Diagnosis Date   Furuncle of groin 02/06/2019   Herpes    Hypertension    Renal disorder    kidney stones    Past Surgical History:  Procedure Laterality Date   COSMETIC SURGERY N/A    [employer] 04/07/2020   DILATION AND CURETTAGE OF UTERUS      Family History  Problem Relation Age of Onset   Hypertension Mother    Hypertension Father    Stroke Father    Diabetes Father    Heart attack Father    Asthma Son    Cancer Maternal Grandmother    Asthma Maternal Grandfather     Social History   Socioeconomic History   Marital status: Single    Spouse name: Not on file   Number of children: Not on file   Years of education: Not on file   Highest education level: Not on file  Occupational History   Not on file  Tobacco Use   Smoking status: Former    Packs/day: 0.25    Types: Cigarettes    Quit date: 02/10/2020    Years since quitting: 2.4   Smokeless tobacco: Never  Vaping Use   Vaping Use: Never used  Substance and Sexual Activity   Alcohol use: Not Currently    Comment: occ   Drug use: No   Sexual  activity: Not Currently    Birth control/protection: None  Other Topics Concern   Not on file  Social History Narrative   Not on file   Social Determinants of Health   Financial Resource Strain: Low Risk  (02/23/2022)   Overall Financial Resource Strain (CARDIA)    Difficulty of Paying Living Expenses: Not very hard  Food Insecurity: No Food Insecurity (05/10/2022)   Hunger Vital Sign    Worried About Running Out of Food in the Last Year: Never true    Ran Out of Food in the Last Year: Never true  Transportation Needs: No Transportation Needs (05/10/2022)   PRAPARE - Hydrologist (Medical): No    Lack of Transportation (Non-Medical): No  Physical Activity: Inactive (02/23/2022)   Exercise Vital Sign    Days of Exercise per Week: 0 days    Minutes of Exercise per Session: 0 min  Stress: No Stress Concern Present (02/23/2022)   [place]    Feeling of Stress : Not at all  Social Connections: Moderately Isolated (02/23/2022)   Social Connection and Isolation Panel [NHANES]    Frequency of  Communication with Friends and Family: More than three times a week    Frequency of Social Gatherings with Friends and Family: Once a week    Attends Religious Services: 1 to 4 times per year    Active Member of [employer] or Organizations: No    Attends Archivist Meetings: Never    Marital Status: Never married  Intimate Partner Violence: Not At Risk (05/10/2022)   Humiliation, Afraid, Rape, and Kick questionnaire    Fear of Current or Ex-Partner: No    Emotionally Abused: No    Physically Abused: No    Sexually Abused: No    Outpatient Medications Prior to Visit  Medication Sig Dispense Refill   Blood Pressure Monitor MISC For regular home bp monitoring during pregnancy 1 each 0   furosemide (LASIX) 20 MG tablet Take 1 tablet (20 mg total) by mouth daily for 3 days. 3 tablet 0   loratadine  (CLARITIN) 10 MG tablet Take 1 tablet (10 mg total) by mouth daily as needed for allergies or itching. (Patient not taking: Reported on 05/18/2022) 30 tablet 5   NIFEdipine (ADALAT CC) 30 MG 24 hr tablet Take 1 tablet (30 mg total) by mouth daily. 90 tablet 4   Prenatal Vit-Fe Fumarate-FA (PRENATAL VITAMIN PO) Take by mouth.     valACYclovir (VALTREX) 1000 MG tablet Take 1 tablet (1,000 mg total) by mouth daily. 30 tablet 6   No facility-administered medications prior to visit.    Allergies  Allergen Reactions   Amoxicillin Itching   Benadryl [Diphenhydramine] Swelling    ROS Review of Systems  Constitutional:  Negative for chills and fever.  HENT:  Negative for congestion, sinus pressure, sinus pain and sore throat.   Eyes:  Negative for pain and discharge.  Respiratory:  Negative for cough and shortness of breath.   Cardiovascular:  Negative for chest pain and palpitations.  Gastrointestinal:  Negative for abdominal pain, constipation, diarrhea, nausea and vomiting.  Endocrine: Negative for polydipsia and polyuria.  Genitourinary:  Negative for dysuria and hematuria.  Musculoskeletal:  Negative for neck pain and neck stiffness.  Skin:  Negative for rash.  Neurological:  Negative for dizziness, weakness and headaches.  Psychiatric/Behavioral:  Negative for agitation and behavioral problems.       Objective:    Physical Exam Vitals reviewed.  Constitutional:      General: She is not in acute distress.    Appearance: She is not diaphoretic.  HENT:     Head: Normocephalic and atraumatic.     Nose: Nose normal.     Mouth/Throat:     Mouth: Mucous membranes are moist.  Eyes:     General: No scleral icterus.    Extraocular Movements: Extraocular movements intact.  Cardiovascular:     Rate and Rhythm: Normal rate and regular rhythm.     Pulses: Normal pulses.     Heart sounds: Normal heart sounds. No murmur heard. Pulmonary:     Breath sounds: Normal breath sounds. No  wheezing or rales.  Musculoskeletal:     Cervical back: Neck supple. No tenderness.     Right lower leg: No edema.     Left lower leg: No edema.  Skin:    General: Skin is warm.     Findings: No rash.  Neurological:     General: No focal deficit present.     Mental Status: She is alert and oriented to person, place, and time.     Sensory: No sensory deficit.  Motor: No weakness.  Psychiatric:        Mood and Affect: Mood normal.        Behavior: Behavior normal.     BP (!) 156/102 (BP Location: Left Arm, Cuff Size: Normal)   Pulse 83   Ht 5' 6"$  (1.676 m)   Wt 210 lb 12.8 oz (95.6 kg)   SpO2 93%   BMI 34.02 kg/m  Wt Readings from Last 3 Encounters:  07/04/22 210 lb 12.8 oz (95.6 kg)  06/22/22 209 lb (94.8 kg)  05/10/22 233 lb 8 oz (105.9 kg)    No results found for: "TSH" Lab Results  Component Value Date   WBC 15.4 (H) 05/11/2022   HGB 11.7 (L) 05/11/2022   HCT 35.5 (L) 05/11/2022   MCV 93.2 05/11/2022   PLT 307 05/11/2022   Lab Results  Component Value Date   NA 136 05/11/2022   K 3.9 05/11/2022   CO2 17 (L) 05/11/2022   GLUCOSE 89 05/11/2022   BUN 6 05/11/2022   CREATININE 0.59 05/11/2022   BILITOT 0.4 05/11/2022   ALKPHOS 139 (H) 05/11/2022   AST 21 05/11/2022   ALT 13 05/11/2022   PROT 6.0 (L) 05/11/2022   ALBUMIN 3.3 (L) 05/11/2022   CALCIUM 9.4 05/11/2022   ANIONGAP 11 05/11/2022   EGFR 120 11/29/2021   No results found for: "CHOL" No results found for: "HDL" No results found for: "LDLCALC" No results found for: "TRIG" No results found for: "CHOLHDL" Lab Results  Component Value Date   HGBA1C 5.0 11/29/2021      Assessment & Plan:   Problem List Items Addressed This Visit       Cardiovascular and Mediastinum   Essential (primary) hypertension - Primary    BP Readings from Last 1 Encounters:  07/04/22 (!) 156/102  Uncontrolled with Nifedipine currently Recent higher BP could be due to volume expansion during pregnancy Added  Olmesartan 20 mg QD Check BMP after 2 weeks Counseled for compliance with the medications Advised DASH diet and moderate exercise/walking, at least 150 mins/week Advised to check BP at home and bring the log in the next visit      Relevant Medications   olmesartan (BENICAR) 20 MG tablet   Other Relevant Orders   Basic Metabolic Panel (BMET)     Other   Obesity (BMI 30.0-34.9)    Diet modification and moderate exercise advised      Pre-school health examination    Needs PPD test today, done today MMR, HbsAb and Varicella ab ordered Form filled out      Relevant Orders   Measles/Mumps/Rubella Immunity   HBsAb Quant HBIG Assessment   Varicella zoster antibody, IgG   Other Visit Diagnoses     Mixed hyperlipidemia       Relevant Medications   olmesartan (BENICAR) 20 MG tablet   Other Relevant Orders   Lipid Profile   Screening for tuberculosis       Relevant Orders   PPD (Completed)       Meds ordered this encounter  Medications   olmesartan (BENICAR) 20 MG tablet    Sig: Take 1 tablet (20 mg total) by mouth daily.    Dispense:  30 tablet    Refill:  3    Follow-up: Return in about 4 weeks (around 08/01/2022) for HTN.    Lindell Spar, MD

## 2022-07-06 NOTE — Assessment & Plan Note (Signed)
Needs PPD test today, done today MMR, HbsAb and Varicella ab ordered Form filled out

## 2022-07-20 ENCOUNTER — Encounter: Payer: Self-pay | Admitting: Adult Health

## 2022-07-20 ENCOUNTER — Ambulatory Visit (INDEPENDENT_AMBULATORY_CARE_PROVIDER_SITE_OTHER): Payer: Medicaid Other | Admitting: Adult Health

## 2022-07-20 VITALS — BP 143/101 | HR 89 | Ht 66.0 in | Wt 213.5 lb

## 2022-07-20 DIAGNOSIS — I10 Essential (primary) hypertension: Secondary | ICD-10-CM

## 2022-07-20 DIAGNOSIS — Z30431 Encounter for routine checking of intrauterine contraceptive device: Secondary | ICD-10-CM | POA: Diagnosis not present

## 2022-07-20 NOTE — Progress Notes (Signed)
  Subjective:     Patient ID: Laura Andersen, female   DOB: 07-02-1990, 32 y.o.   MRN: [id number]  HPI Tamari is a 32 year old black female, single, [id number] in for IUD check, had mirena placed 06/22/22 at postpartum visit.  Last pap was negative HPV,NILM 02/19/23  PCP is Dr Posey Pronto   Review of Systems For IUD check Reviewed past medical,surgical, social and family history. Reviewed medications and allergies.     Objective:   Physical Exam BP (!) 143/101 (BP Location: Right Arm, Patient Position: Sitting, Cuff Size: Normal)   Pulse 89   Ht 5' 6"$  (1.676 m)   Wt 213 lb 8 oz (96.8 kg)   Breastfeeding No   BMI 34.46 kg/m   Skin warm and dry. Pelvic: external genitalia is normal in appearance no lesions, vagina:pink and moist,urethra has no lesions or masses noted, cervix:smooth and bulbous,+IUD strings at os, uterus: normal size, shape and contour, non tender, no masses felt, adnexa: no masses or tenderness noted. Bladder is non tender and no masses felt.     Upstream - 07/20/22 1426       Pregnancy Intention Screening   Does the patient want to become pregnant in the next year? No    Does the patient's partner want to become pregnant in the next year? No    Would the patient like to discuss contraceptive options today? No      Contraception Wrap Up   Current Method IUD or IUS    End Method IUD or IUS            Examination chaperoned by Levy Pupa LPN   Assessment:     1. IUD check up Mirena was placed 06/22/22 +strings at os   2. Essential (primary) hypertension Take BP Meds, did not take this morning Follow up with Dr Posey Pronto     Plan:     Return about 02/17/23 for pap and physical

## 2022-07-25 DIAGNOSIS — Z23 Encounter for immunization: Secondary | ICD-10-CM | POA: Diagnosis not present

## 2022-08-01 ENCOUNTER — Encounter: Payer: Self-pay | Admitting: Internal Medicine

## 2022-08-01 ENCOUNTER — Ambulatory Visit (INDEPENDENT_AMBULATORY_CARE_PROVIDER_SITE_OTHER): Payer: Medicaid Other | Admitting: Internal Medicine

## 2022-08-01 VITALS — BP 118/75 | HR 109 | Ht 66.0 in | Wt 210.6 lb

## 2022-08-01 DIAGNOSIS — E669 Obesity, unspecified: Secondary | ICD-10-CM | POA: Diagnosis not present

## 2022-08-01 DIAGNOSIS — I10 Essential (primary) hypertension: Secondary | ICD-10-CM | POA: Diagnosis not present

## 2022-08-01 DIAGNOSIS — E782 Mixed hyperlipidemia: Secondary | ICD-10-CM | POA: Diagnosis not present

## 2022-08-01 NOTE — Patient Instructions (Signed)
Please continue taking medications as prescribed.  Please continue to follow low salt diet and ambulate as tolerated. 

## 2022-08-01 NOTE — Progress Notes (Signed)
Established Patient Office Visit  Subjective:  Patient ID: Laura Andersen, female    DOB: 1990-08-17  Age: 32 y.o. MRN: [id number]  CC:  Chief Complaint  Patient presents with   Hypertension    Four week follow up    HPI Laura Andersen is a 32 y.o. female with past medical history of HTN who presents for f/u of her chronic medical conditions.  HTN: She was on labetalol for HTN during her pregnancy. She was placed on Nifedipine by her Ob.Gyn. after pregnancy. She was placed on Olmesartan 20 mg in the last visit due to uncontrolled HTN. Her BP is wnl today.  She denies any headache, dizziness, chest pain, dyspnea or palpitations currently.  Denies any leg swelling.  She is not breast-feeding currently.   Past Medical History:  Diagnosis Date   Furuncle of groin 02/06/2019   Herpes    Hypertension    Renal disorder    kidney stones    Past Surgical History:  Procedure Laterality Date   COSMETIC SURGERY N/A    Phreesia 04/07/2020   DILATION AND CURETTAGE OF UTERUS      Family History  Problem Relation Age of Onset   Cancer Maternal Grandmother    Asthma Maternal Grandfather    Hypertension Father    Stroke Father    Diabetes Father    Heart attack Father    Hypertension Mother    Asthma Son     Social History   Socioeconomic History   Marital status: Single    Spouse name: Not on file   Number of children: Not on file   Years of education: Not on file   Highest education level: Not on file  Occupational History   Not on file  Tobacco Use   Smoking status: Former    Packs/day: 0.25    Types: Cigarettes    Quit date: 02/10/2020    Years since quitting: 2.4   Smokeless tobacco: Never  Vaping Use   Vaping Use: Never used  Substance and Sexual Activity   Alcohol use: Yes    Comment: occ   Drug use: No   Sexual activity: Yes    Birth control/protection: I.U.D.  Other Topics Concern   Not on file  Social History Narrative   Not on file    Social Determinants of Health   Financial Resource Strain: Low Risk  (02/23/2022)   Overall Financial Resource Strain (CARDIA)    Difficulty of Paying Living Expenses: Not very hard  Food Insecurity: No Food Insecurity (05/10/2022)   Hunger Vital Sign    Worried About Running Out of Food in the Last Year: Never true    Ran Out of Food in the Last Year: Never true  Transportation Needs: No Transportation Needs (05/10/2022)   PRAPARE - Hydrologist (Medical): No    Lack of Transportation (Non-Medical): No  Physical Activity: Inactive (02/23/2022)   Exercise Vital Sign    Days of Exercise per Week: 0 days    Minutes of Exercise per Session: 0 min  Stress: No Stress Concern Present (02/23/2022)   [place]    Feeling of Stress : Not at all  Social Connections: Moderately Isolated (02/23/2022)   Social Connection and Isolation Panel [NHANES]    Frequency of Communication with Friends and Family: More than three times a week    Frequency of Social Gatherings with Friends and Family: Once a  week    Attends Religious Services: 1 to 4 times per year    Active Member of Clubs or Organizations: No    Attends Archivist Meetings: Never    Marital Status: Never married  Intimate Partner Violence: Not At Risk (05/10/2022)   Humiliation, Afraid, Rape, and Kick questionnaire    Fear of Current or Ex-Partner: No    Emotionally Abused: No    Physically Abused: No    Sexually Abused: No    Outpatient Medications Prior to Visit  Medication Sig Dispense Refill   Blood Pressure Monitor MISC For regular home bp monitoring during pregnancy 1 each 0   levonorgestrel (MIRENA) 20 MCG/DAY IUD 1 each by Intrauterine route once.     loratadine (CLARITIN) 10 MG tablet Take 1 tablet (10 mg total) by mouth daily as needed for allergies or itching. 30 tablet 5   NIFEdipine (ADALAT CC) 30 MG 24 hr tablet  Take 1 tablet (30 mg total) by mouth daily. 90 tablet 4   olmesartan (BENICAR) 20 MG tablet Take 1 tablet (20 mg total) by mouth daily. 30 tablet 3   Prenatal Vit-Fe Fumarate-FA (PRENATAL VITAMIN PO) Take by mouth.     valACYclovir (VALTREX) 1000 MG tablet Take 1 tablet (1,000 mg total) by mouth daily. 30 tablet 6   furosemide (LASIX) 20 MG tablet Take 1 tablet (20 mg total) by mouth daily for 3 days. 3 tablet 0   No facility-administered medications prior to visit.    Allergies  Allergen Reactions   Amoxicillin Itching   Benadryl [Diphenhydramine] Swelling    ROS Review of Systems  Constitutional:  Negative for chills and fever.  HENT:  Negative for congestion, sinus pressure, sinus pain and sore throat.   Eyes:  Negative for pain and discharge.  Respiratory:  Negative for cough and shortness of breath.   Cardiovascular:  Negative for chest pain and palpitations.  Gastrointestinal:  Negative for abdominal pain, constipation, diarrhea, nausea and vomiting.  Endocrine: Negative for polydipsia and polyuria.  Genitourinary:  Negative for dysuria and hematuria.  Musculoskeletal:  Negative for neck pain and neck stiffness.  Skin:  Negative for rash.  Neurological:  Negative for dizziness, weakness and headaches.  Psychiatric/Behavioral:  Negative for agitation and behavioral problems.       Objective:    Physical Exam Vitals reviewed.  Constitutional:      General: She is not in acute distress.    Appearance: She is not diaphoretic.  HENT:     Head: Normocephalic and atraumatic.     Nose: Nose normal.     Mouth/Throat:     Mouth: Mucous membranes are moist.  Eyes:     General: No scleral icterus.    Extraocular Movements: Extraocular movements intact.  Cardiovascular:     Rate and Rhythm: Normal rate and regular rhythm.     Pulses: Normal pulses.     Heart sounds: Normal heart sounds. No murmur heard. Pulmonary:     Breath sounds: Normal breath sounds. No wheezing or  rales.  Musculoskeletal:     Cervical back: Neck supple. No tenderness.     Right lower leg: No edema.     Left lower leg: No edema.  Skin:    General: Skin is warm.     Findings: No rash.  Neurological:     General: No focal deficit present.     Mental Status: She is alert and oriented to person, place, and time.     Sensory:  No sensory deficit.     Motor: No weakness.  Psychiatric:        Mood and Affect: Mood normal.        Behavior: Behavior normal.     BP 118/75 (BP Location: Left Arm, Patient Position: Sitting, Cuff Size: Large)   Pulse (!) 109   Ht '5\' 6"'$  (1.676 m)   Wt 210 lb 9.6 oz (95.5 kg)   SpO2 96%   BMI 33.99 kg/m  Wt Readings from Last 3 Encounters:  08/01/22 210 lb 9.6 oz (95.5 kg)  07/20/22 213 lb 8 oz (96.8 kg)  07/04/22 210 lb 12.8 oz (95.6 kg)    No results found for: "TSH" Lab Results  Component Value Date   WBC 15.4 (H) 05/11/2022   HGB 11.7 (L) 05/11/2022   HCT 35.5 (L) 05/11/2022   MCV 93.2 05/11/2022   PLT 307 05/11/2022   Lab Results  Component Value Date   NA 136 05/11/2022   K 3.9 05/11/2022   CO2 17 (L) 05/11/2022   GLUCOSE 89 05/11/2022   BUN 6 05/11/2022   CREATININE 0.59 05/11/2022   BILITOT 0.4 05/11/2022   ALKPHOS 139 (H) 05/11/2022   AST 21 05/11/2022   ALT 13 05/11/2022   PROT 6.0 (L) 05/11/2022   ALBUMIN 3.3 (L) 05/11/2022   CALCIUM 9.4 05/11/2022   ANIONGAP 11 05/11/2022   EGFR 120 11/29/2021   No results found for: "CHOL" No results found for: "HDL" No results found for: "LDLCALC" No results found for: "TRIG" No results found for: "CHOLHDL" Lab Results  Component Value Date   HGBA1C 5.0 11/29/2021      Assessment & Plan:   Problem List Items Addressed This Visit       Cardiovascular and Mediastinum   Essential (primary) hypertension - Primary    BP Readings from Last 1 Encounters:  08/01/22 118/75  Well-controlled with Nifedipine and Olmesartan Check BMP Plan to adjust dose of Olmesartan and DC  Nifedipine Counseled for compliance with the medications Advised DASH diet and moderate exercise/walking, at least 150 mins/week Advised to check BP at home and bring the log in the next visit        Other   Obesity (BMI 30.0-34.9)    Diet modification and moderate exercise advised       No orders of the defined types were placed in this encounter.   Follow-up: Return in about 6 months (around 02/01/2023) for HTN.    Lindell Spar, MD

## 2022-08-01 NOTE — Assessment & Plan Note (Signed)
BP Readings from Last 1 Encounters:  08/01/22 118/75   Well-controlled with Nifedipine and Olmesartan Check BMP Plan to adjust dose of Olmesartan and DC Nifedipine Counseled for compliance with the medications Advised DASH diet and moderate exercise/walking, at least 150 mins/week Advised to check BP at home and bring the log in the next visit

## 2022-08-01 NOTE — Assessment & Plan Note (Signed)
Diet modification and moderate exercise advised. 

## 2022-08-02 LAB — BASIC METABOLIC PANEL
BUN/Creatinine Ratio: 15 (ref 9–23)
BUN: 12 mg/dL (ref 6–20)
CO2: 23 mmol/L (ref 20–29)
Calcium: 9.5 mg/dL (ref 8.7–10.2)
Chloride: 102 mmol/L (ref 96–106)
Creatinine, Ser: 0.82 mg/dL (ref 0.57–1.00)
Glucose: 81 mg/dL (ref 70–99)
Potassium: 3.7 mmol/L (ref 3.5–5.2)
Sodium: 139 mmol/L (ref 134–144)
eGFR: 96 mL/min/{1.73_m2} (ref >59)

## 2022-08-02 LAB — LIPID PANEL
Chol/HDL Ratio: 4.3 ratio (ref 0.0–4.4)
Cholesterol, Total: 153 mg/dL (ref 100–199)
HDL: 36 mg/dL — ABNORMAL LOW (ref >39)
LDL Chol Calc (NIH): 91 mg/dL (ref 0–99)
Triglycerides: 146 mg/dL (ref 0–149)
VLDL Cholesterol Cal: 26 mg/dL (ref 5–40)

## 2022-08-24 ENCOUNTER — Telehealth: Payer: Self-pay | Admitting: Internal Medicine

## 2022-08-24 ENCOUNTER — Other Ambulatory Visit: Payer: Self-pay

## 2022-08-24 DIAGNOSIS — J302 Other seasonal allergic rhinitis: Secondary | ICD-10-CM

## 2022-08-24 MED ORDER — LORATADINE 10 MG PO TABS: 10.0000 mg | ORAL_TABLET | Freq: Every day | ORAL | 5 refills | Status: AC | PRN

## 2022-08-24 NOTE — Telephone Encounter (Signed)
Prescription Request  08/24/2022  LOV: 08/01/2022  What is the name of the medication or equipment? loratadine (CLARITIN) 10 MG tablet   Have you contacted your pharmacy to request a refill? No   Which pharmacy would you like this sent to?  [facility], [place] [address] Phone: [phone] Fax: [phone]    Patient notified that their request is being sent to the clinical staff for review and that they should receive a response within 2 business days.   Please advise at [facility] [phone]

## 2022-08-24 NOTE — Telephone Encounter (Signed)
Refills sent to pharmacy. 

## 2022-09-30 ENCOUNTER — Ambulatory Visit: Payer: Medicaid Other | Admitting: Internal Medicine

## 2022-10-10 ENCOUNTER — Encounter: Payer: Self-pay | Admitting: Adult Health

## 2022-10-10 ENCOUNTER — Ambulatory Visit (INDEPENDENT_AMBULATORY_CARE_PROVIDER_SITE_OTHER): Payer: Medicaid Other | Admitting: Adult Health

## 2022-10-10 ENCOUNTER — Other Ambulatory Visit (HOSPITAL_COMMUNITY)
Admission: RE | Admit: 2022-10-10 | Discharge: 2022-10-10 | Disposition: A | Discharge status: Home / Self Care | Payer: Medicaid Other | Source: Ambulatory Visit | Attending: Adult Health | Admitting: Adult Health

## 2022-10-10 VITALS — BP 136/87 | HR 104 | Ht 66.0 in | Wt 214.0 lb

## 2022-10-10 DIAGNOSIS — N898 Other specified noninflammatory disorders of vagina: Secondary | ICD-10-CM | POA: Diagnosis not present

## 2022-10-10 DIAGNOSIS — R102 Pelvic and perineal pain: Secondary | ICD-10-CM | POA: Insufficient documentation

## 2022-10-10 DIAGNOSIS — Z30431 Encounter for routine checking of intrauterine contraceptive device: Secondary | ICD-10-CM | POA: Diagnosis not present

## 2022-10-10 DIAGNOSIS — Z113 Encounter for screening for infections with a predominantly sexual mode of transmission: Secondary | ICD-10-CM | POA: Insufficient documentation

## 2022-10-10 NOTE — Progress Notes (Signed)
  Subjective:     Patient ID: Laura Andersen, female   DOB: [birth year], 32 y.o.   MRN: [id number]  HPI Laura Andersen is a 32 year old black female,single, [id number] in complaining of vaginal discharge with odor, and some cramping, she wants IUD check and STD screening.     Component Value Date/Time   DIAGPAP  02/19/2020 1338    - Negative for Intraepithelial Lesions or Malignancy (NILM)   DIAGPAP - Benign reactive/reparative changes 02/19/2020 1338   DIAGPAP  02/06/2019 1351    - Negative for intraepithelial lesion or malignancy (NILM)   HPVHIGH Negative 02/19/2020 1338   HPVHIGH Negative 02/06/2019 1351   ADEQPAP  02/19/2020 1338    Satisfactory for evaluation; transformation zone component PRESENT.   ADEQPAP  02/06/2019 1351    Satisfactory for evaluation; transformation zone component ABSENT.   ADEQPAP  11/24/2016 0000    Satisfactory for evaluation  endocervical/transformation zone component PRESENT.   PCP is Dr Allena Katz.  Review of Systems +vaginal discharge +vaginal odor +cramping Denies any itching or burning Reviewed past medical,surgical, social and family history. Reviewed medications and allergies.     Objective:   Physical Exam BP 136/87 (BP Location: Left Arm, Patient Position: Sitting, Cuff Size: Normal)   Pulse (!) 104   Ht 5\' 6"  (1.676 m)   Wt 214 lb (97.1 kg)   BMI 34.54 kg/m     Skin warm and dry.Pelvic: external genitalia is normal in appearance no lesions, vagina: white discharge without odor,urethra has no lesions or masses noted, cervix: bulbous,+IUD strings at os, uterus: normal size, shape and contour, non tender, no masses felt, adnexa: no masses or tenderness noted. Bladder is non tender and no masses felt. CV swab obtained. 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 No    Does the patient's partner want to become pregnant in the next year? No    Would the patient like to discuss contraceptive options today? No      Contraception Wrap Up   Current Method IUD or IUS    End Method IUD or IUS    Contraception Counseling Provided No            Examination chaperoned by Tish RN  Assessment:     1. Vaginal discharge CV swab sent  - Cervicovaginal ancillary only( [place])  2. Vaginal odor CV swab sent  - Cervicovaginal ancillary only( [place])  3. Pelvic cramping CV swab sent  - Cervicovaginal ancillary only( )  4. Screening examination for STD (sexually transmitted disease) CV swab sent for GC/CHL,trich BV and yeast  - Cervicovaginal ancillary only( )  5. IUD check up +strings at os Mirena wa splaced 06/22/22    Plan:     Follow up prn

## 2022-10-12 ENCOUNTER — Other Ambulatory Visit: Payer: Self-pay | Admitting: Adult Health

## 2022-10-12 LAB — CERVICOVAGINAL ANCILLARY ONLY
Bacterial Vaginitis (gardnerella): POSITIVE — AB
Candida Glabrata: NEGATIVE
Candida Vaginitis: NEGATIVE
Chlamydia: NEGATIVE
Comment: NEGATIVE
Comment: NEGATIVE
Comment: NEGATIVE
Comment: NEGATIVE
Comment: NEGATIVE
Comment: NORMAL
Neisseria Gonorrhea: NEGATIVE
Trichomonas: NEGATIVE

## 2022-10-12 MED ORDER — METRONIDAZOLE 500 MG PO TABS: 500.0000 mg | ORAL_TABLET | Freq: Two times a day (BID) | ORAL | 0 refills | Status: DC

## 2022-10-12 NOTE — Progress Notes (Signed)
+  BV on vaginal swab rx flagyl, no sex or alcohol while taking  

## 2022-12-01 ENCOUNTER — Encounter: Payer: Self-pay | Admitting: Emergency Medicine

## 2022-12-01 ENCOUNTER — Ambulatory Visit
Admission: EM | Admit: 2022-12-01 | Discharge: 2022-12-01 | Disposition: A | Discharge status: Home / Self Care | Payer: Medicaid Other | Attending: Nurse Practitioner | Admitting: Nurse Practitioner

## 2022-12-01 DIAGNOSIS — U071 COVID-19: Secondary | ICD-10-CM | POA: Diagnosis not present

## 2022-12-01 MED ORDER — PROMETHAZINE-DM 6.25-15 MG/5ML PO SYRP: 5.0000 mL | ORAL_SOLUTION | Freq: Four times a day (QID) | ORAL | 0 refills | Status: DC | PRN

## 2022-12-01 MED ORDER — FLUTICASONE PROPIONATE 50 MCG/ACT NA SUSP: 2.0000 | Freq: Every day | NASAL | 0 refills | Status: AC

## 2022-12-01 MED ORDER — PAXLOVID (300/100) 20 X 150 MG & 10 X 100MG PO TBPK: 3.0000 | ORAL_TABLET | Freq: Two times a day (BID) | ORAL | 0 refills | Status: AC

## 2022-12-01 NOTE — ED Provider Notes (Signed)
[facility]    CSN: [id number] Arrival date & time: 12/01/22  0802      History   Chief Complaint No chief complaint on file.   HPI Laura Andersen is a 32 y.o. female.   The history is provided by the patient.   The patient presents after she tested positive for COVID at home.  Patient's symptoms started 2 to 3 days ago with sore throat, body aches, nasal congestion, and cough.  Patient denies fever, chills, headache, ear pain, wheezing, shortness of breath, difficulty breathing, abdominal pain, nausea, vomiting, or diarrhea.  Patient reports that she has received 2 doses of the COVID-vaccine.  Patient reports she has been taking over-the-counter cough and cold medication for her symptoms.  Positive home COVID test on yesterday.  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 Authorizing Provider   fluticasone (FLONASE) 50 MCG/ACT nasal spray Place 2 sprays into both nostrils daily. 12/01/22  Yes Maxx Calaway-Warren, Sadie Haber, NP  nirmatrelvir & ritonavir (PAXLOVID, 300/100,) 20 x 150 MG & 10 x 100MG  TBPK Take 3 tablets by mouth 2 (two) times daily for 5 days. 12/01/22 12/06/22 Yes Usiel Astarita-Warren, Sadie Haber, NP  promethazine-dextromethorphan (PROMETHAZINE-DM) 6.25-15 MG/5ML syrup Take 5 mLs by mouth 4 (four) times daily as needed for cough. 12/01/22  Yes Shykeria Sakamoto-Warren, Sadie Haber, NP  Blood Pressure Monitor MISC For regular home bp monitoring during pregnancy 01/26/22   Myna Hidalgo, DO  levonorgestrel (MIRENA) 20 MCG/DAY IUD 1 each by Intrauterine route once.    [provider]  loratadine (CLARITIN) 10 MG tablet Take 1 tablet (10 mg total) by mouth daily as needed for allergies or itching. 08/24/22   Anabel Halon, MD  NIFEdipine (ADALAT CC) 30 MG 24 hr tablet Take 1 tablet (30 mg total) by mouth daily. 06/22/22   Arabella Merles, CNM  olmesartan (BENICAR) 20 MG tablet Take 1 tablet (20 mg total) by mouth daily. 07/04/22   Anabel Halon, MD  valACYclovir (VALTREX) 1000 MG tablet Take 1 tablet (1,000 mg total) by mouth daily. 06/22/22   Clelia Croft,  Marcelle Smiling, CNM    Family History Family History  Problem Relation Age of Onset   Cancer Maternal Grandmother    Asthma Maternal Grandfather    Hypertension Father    Stroke Father    Diabetes Father    Heart attack Father    Hypertension Mother    Asthma Son     Social History Social History   Tobacco Use   Smoking status: Former    Current packs/day: 0.00    Types: Cigarettes    Quit date: 02/10/2020    Years since quitting: 2.8   Smokeless tobacco: Never  Vaping Use   Vaping status: Never Used  Substance Use Topics   Alcohol use: Yes    Comment: occ   Drug use: No     Allergies   Amoxicillin and Benadryl [diphenhydramine]   Review of Systems Review of Systems Per HPI  Physical Exam Triage Vital Signs ED Triage Vitals   Encounter Vitals Group     BP 12/01/22 0811 134/89     Systolic BP Percentile --      Diastolic BP Percentile --      Pulse Rate 12/01/22 0811 92     Resp 12/01/22 0811 18     Temp 12/01/22 0811 98.6 F (37 C)     Temp src --      SpO2 12/01/22 0811 96 %     Weight --      Height --      Head Circumference --      Peak Flow --      Pain Score 12/01/22 0813 0     Pain Loc --      Pain Education --      Exclude from Growth Chart --    No data found.  Updated Vital Signs BP 134/89 (BP Location: Right Arm)   Pulse 92   Temp 98.6 F (37 C)   Resp 18   SpO2 96%   Visual Acuity Right Eye Distance:   Left Eye Distance:   Bilateral Distance:    Right Eye Near:   Left Eye Near:    Bilateral Near:     Physical Exam Vitals and nursing note reviewed.  Constitutional:      General: She is not in acute distress.    Appearance: Normal appearance. She is well-developed.  HENT:     Head: Normocephalic.     Right Ear: Tympanic membrane, ear canal and external ear normal.     Left Ear: Tympanic membrane, ear canal and external ear normal.     Nose: Congestion present. No rhinorrhea.     Mouth/Throat:     Mouth: Mucous membranes are moist.     Pharynx: Posterior oropharyngeal erythema present.     Comments: Cobblestoning present to posterior oropharynx Eyes:     Extraocular Movements: Extraocular movements intact.     Conjunctiva/sclera: Conjunctivae normal.     Pupils: Pupils are equal, round, and reactive to light.  Cardiovascular:     Rate and Rhythm: Normal rate and regular rhythm.     Pulses: Normal pulses.     Heart sounds: Normal heart sounds.  Pulmonary:     Effort: Pulmonary effort is normal.     Breath sounds: Normal breath sounds.  Abdominal:     General: Bowel sounds are normal.     Palpations: Abdomen is soft.     Tenderness: There is no abdominal tenderness.  Musculoskeletal:     Cervical back: Normal range  of motion.  Lymphadenopathy:     Cervical: No  cervical adenopathy.  Skin:    General: Skin is warm and dry.  Neurological:     General: No focal deficit present.     Mental Status: She is alert and oriented to person, place, and time.     Cranial Nerves: No cranial nerve deficit.  Psychiatric:        Mood and Affect: Mood normal.        Behavior: Behavior normal.      UC Treatments / Results  Labs (all labs ordered are listed, but only abnormal results are displayed) Labs Reviewed - No data to display  EKG   Radiology No results found.  Procedures Procedures (including critical care time)  Medications Ordered in UC Medications - No data to display  Initial Impression / Assessment and Plan / UC Course  I have reviewed the triage vital signs and the nursing notes.  Pertinent labs & imaging results that were available during my care of the patient were reviewed by me and considered in my medical decision making (see chart for details).  The patient is well-appearing, she is in no acute distress, vital signs are stable.  Patient with positive self-administered home COVID test.  Paxlovid 300/100 mg tablets were prescribed.  For patient's cough, Promethazine DM was prescribed, and for her nasal congestion, fluticasone 50 mcg nasal spray was prescribed.  Supportive care recommendations were provided and discussed with the patient to include increasing fluids, allowing for plenty of rest, use of over-the-counter analgesics for pain or discomfort, and use of a humidifier in her bedroom at nighttime during sleep.  Discussed COVID isolation recommendations per current CDC guidelines.  Patient was also given strict ER follow-up precautions.  Patient was in agreement with this plan of care and verbalizes understanding.  All questions were answered.  Patient stable for discharge.  Work note was provided. Final Clinical Impressions(s) / UC Diagnoses   Final diagnoses:  Positive self-administered antigen test for COVID-19      Discharge Instructions      I am treating you for COVID with Paxlovid.  Take medication as prescribed. Increase fluids and allow for plenty of rest. May take over-the-counter Tylenol or ibuprofen as needed for pain, fever, general discomfort. Recommend using normal saline nasal spray throughout the day to help with nasal congestion and runny nose. For your cough, recommend using a humidifier in your bedroom at nighttime during sleep or sleeping elevated on pillows. You will need to continue wearing your mask for the next 5 days until you complete the medication.  If you continue to experience symptoms after the medication, continue to wear your mask for an additional 5 days. Remain home until you have been fever free for at least 24 hours. If you experience shortness of breath, difficulty breathing, or other concerns, please follow-up in the emergency department immediately for further evaluation. Follow-up as needed.     ED Prescriptions     Medication Sig Dispense Auth. Provider   nirmatrelvir & ritonavir (PAXLOVID, 300/100,) 20 x 150 MG & 10 x 100MG  TBPK Take 3 tablets by mouth 2 (two) times daily for 5 days. 30 tablet Edson Deridder-Warren, Sadie Haber, NP   promethazine-dextromethorphan (PROMETHAZINE-DM) 6.25-15 MG/5ML syrup Take 5 mLs by mouth 4 (four) times daily as needed for cough. 118 mL Taym Twist-Warren, Sadie Haber, NP   fluticasone (FLONASE) 50 MCG/ACT nasal spray Place 2 sprays into both nostrils daily. 16 g Ronda Kazmi-Warren, Sadie Haber, NP  PDMP not reviewed this encounter.   Abran Cantor, NP 12/01/22 1020

## 2022-12-01 NOTE — Discharge Instructions (Signed)
I am treating you for COVID with Paxlovid.  Take medication as prescribed. Increase fluids and allow for plenty of rest. May take over-the-counter Tylenol or ibuprofen as needed for pain, fever, general discomfort. Recommend using normal saline nasal spray throughout the day to help with nasal congestion and runny nose. For your cough, recommend using a humidifier in your bedroom at nighttime during sleep or sleeping elevated on pillows. You will need to continue wearing your mask for the next 5 days until you complete the medication.  If you continue to experience symptoms after the medication, continue to wear your mask for an additional 5 days. Remain home until you have been fever free for at least 24 hours. If you experience shortness of breath, difficulty breathing, or other concerns, please follow-up in the emergency department immediately for further evaluation. Follow-up as needed.

## 2022-12-01 NOTE — ED Triage Notes (Signed)
Sore throat on Sunday and body aches on Monday.  Home covid test positive yesterday

## 2022-12-08 ENCOUNTER — Other Ambulatory Visit (INDEPENDENT_AMBULATORY_CARE_PROVIDER_SITE_OTHER): Payer: Medicaid Other

## 2022-12-08 ENCOUNTER — Other Ambulatory Visit (HOSPITAL_COMMUNITY)
Admission: RE | Admit: 2022-12-08 | Discharge: 2022-12-08 | Disposition: A | Discharge status: Home / Self Care | Payer: Medicaid Other | Source: Ambulatory Visit | Attending: Obstetrics & Gynecology | Admitting: Obstetrics & Gynecology

## 2022-12-08 DIAGNOSIS — Z113 Encounter for screening for infections with a predominantly sexual mode of transmission: Secondary | ICD-10-CM | POA: Diagnosis not present

## 2022-12-08 DIAGNOSIS — Z202 Contact with and (suspected) exposure to infections with a predominantly sexual mode of transmission: Secondary | ICD-10-CM | POA: Diagnosis not present

## 2022-12-08 NOTE — Progress Notes (Addendum)
   NURSE VISIT- STD  SUBJECTIVE:  Laura Andersen is a 32 y.o. [id number] GYN patientfemale here for a vaginal swab for STD screen as she just found out her ex-partner tested positive for chlamydia.  Has not been with him in about 6 weeks but has been since her last check with [place]. She reports the following symptoms: none.  Denies abnormal vaginal bleeding, significant pelvic pain, fever, or UTI symptoms.  OBJECTIVE:  There were no vitals taken for this visit.  Appears well, in no apparent distress  ASSESSMENT: Vaginal swab for STD screen  PLAN: Self-collected vaginal probe for Gonorrhea, Chlamydia, Trichomonas, Bacterial Vaginosis, Yeast sent to lab Treatment: to be determined once results are received Follow-up as needed if symptoms persist/worsen, or new symptoms develop  [name]  12/08/2022 4:31 PM

## 2022-12-12 LAB — CERVICOVAGINAL ANCILLARY ONLY
Bacterial Vaginitis (gardnerella): NEGATIVE
Candida Glabrata: NEGATIVE
Candida Vaginitis: NEGATIVE
Chlamydia: NEGATIVE
Comment: NEGATIVE
Comment: NEGATIVE
Comment: NEGATIVE
Comment: NEGATIVE
Comment: NEGATIVE
Comment: NORMAL
Neisseria Gonorrhea: NEGATIVE
Trichomonas: NEGATIVE

## 2023-01-20 ENCOUNTER — Other Ambulatory Visit (HOSPITAL_COMMUNITY): Payer: Self-pay

## 2023-02-02 ENCOUNTER — Ambulatory Visit (INDEPENDENT_AMBULATORY_CARE_PROVIDER_SITE_OTHER): Payer: Medicaid Other | Admitting: Internal Medicine

## 2023-02-02 ENCOUNTER — Encounter: Payer: Self-pay | Admitting: Internal Medicine

## 2023-02-02 VITALS — BP 108/68 | HR 103 | Ht 66.0 in | Wt 205.0 lb

## 2023-02-02 DIAGNOSIS — E669 Obesity, unspecified: Secondary | ICD-10-CM | POA: Diagnosis not present

## 2023-02-02 DIAGNOSIS — I10 Essential (primary) hypertension: Secondary | ICD-10-CM

## 2023-02-02 DIAGNOSIS — Z23 Encounter for immunization: Secondary | ICD-10-CM

## 2023-02-02 DIAGNOSIS — J302 Other seasonal allergic rhinitis: Secondary | ICD-10-CM | POA: Diagnosis not present

## 2023-02-02 DIAGNOSIS — J309 Allergic rhinitis, unspecified: Secondary | ICD-10-CM | POA: Insufficient documentation

## 2023-02-02 MED ORDER — OLMESARTAN MEDOXOMIL 20 MG PO TABS: 20.0000 mg | ORAL_TABLET | Freq: Every day | ORAL | 5 refills | Status: DC

## 2023-02-02 NOTE — Assessment & Plan Note (Signed)
BMI Readings from Last 3 Encounters:  02/02/23 33.09 kg/m  10/10/22 34.54 kg/m  08/01/22 33.99 kg/m   Diet modification and moderate exercise advised

## 2023-02-02 NOTE — Progress Notes (Signed)
Established Patient Office Visit  Subjective:  Patient ID: Laura Andersen, female    DOB: January 05, 1991  Age: 32 y.o. MRN: [id number]  CC:  Chief Complaint  Patient presents with   Hypertension    Follow up    HPI Laura Andersen is a 32 y.o. female with past medical history of HTN who presents for f/u of her chronic medical conditions.  HTN: Her BP is well controlled today.  Of note, she is taking olmesartan and nifedipine on alternate days as she was having hypotensive spells with both medicines together.  She denies any headache, dizziness, chest pain, dyspnea or palpitations currently.  She has tried Claritin for allergies.  She had COVID infection in 07/24 and had Paxlovid.  She currently denies any cough, dyspnea or wheezing.   Past Medical History:  Diagnosis Date   Furuncle of groin 02/06/2019   Herpes    Hypertension    Renal disorder    kidney stones    Past Surgical History:  Procedure Laterality Date   COSMETIC SURGERY N/A    [employer]reesia 04/07/2020   DILATION AND CURETTAGE OF UTERUS      Family History  Problem Relation Age of Onset   Cancer Maternal Grandmother    Asthma Maternal Grandfather    Hypertension Father    Stroke Father    Diabetes Father    Heart attack Father    Hypertension Mother    Asthma Son     Social History   Socioeconomic History   Marital status: Single    Spouse name: Not on file   Number of children: Not on file   Years of education: Not on file   Highest education level: Not on file  Occupational History   Not on file  Tobacco Use   Smoking status: Former    Current packs/day: 0.00    Types: Cigarettes    Quit date: 02/10/2020    Years since quitting: 2.9   Smokeless tobacco: Never  Vaping Use   Vaping status: Never Used  Substance and Sexual Activity   Alcohol use: Yes    Comment: occ   Drug use: No   Sexual activity: Yes    Birth control/protection: I.U.D.  Other Topics Concern   Not on file   Social History Narrative   Not on file   Social Determinants of Health   Financial Resource Strain: Low Risk  (02/23/2022)   Overall Financial Resource Strain (CARDIA)    Difficulty of Paying Living Expenses: Not very hard  Food Insecurity: No Food Insecurity (05/10/2022)   Hunger Vital Sign    Worried About Running Out of Food in the Last Year: Never true    Ran Out of Food in the Last Year: Never true  Transportation Needs: No Transportation Needs (05/10/2022)   PRAPARE - Administrator, Civil Service (Medical): No    Lack of Transportation (Non-Medical): No  Physical Activity: Inactive (02/23/2022)   Exercise Vital Sign    Days of Exercise per Week: 0 days    Minutes of Exercise per Session: 0 min  Stress: No Stress Concern Present (02/23/2022)   [employer] of Occupational Health - Occupational Stress Questionnaire    Feeling of Stress : Not at all  Social Connections: Moderately Isolated (02/23/2022)   Social Connection and Isolation Panel [NHANES]    Frequency of Communication with Friends and Family: More than three times a week    Frequency of Social Gatherings with Friends and  Family: Once a week    Attends Religious Services: 1 to 4 times per year    Active Member of Clubs or Organizations: No    Attends Banker Meetings: Never    Marital Status: Never married  Intimate Partner Violence: Not At Risk (05/10/2022)   Humiliation, Afraid, Rape, and Kick questionnaire    Fear of Current or Ex-Partner: No    Emotionally Abused: No    Physically Abused: No    Sexually Abused: No    Outpatient Medications Prior to Visit  Medication Sig Dispense Refill   Blood Pressure Monitor MISC For regular home bp monitoring during pregnancy 1 each 0   fluticasone (FLONASE) 50 MCG/ACT nasal spray Place 2 sprays into both nostrils daily. 16 g 0   levonorgestrel (MIRENA) 20 MCG/DAY IUD 1 each by Intrauterine route once.     loratadine (CLARITIN) 10 MG tablet  Take 1 tablet (10 mg total) by mouth daily as needed for allergies or itching. 30 tablet 5   valACYclovir (VALTREX) 1000 MG tablet Take 1 tablet (1,000 mg total) by mouth daily. 30 tablet 6   NIFEdipine (ADALAT CC) 30 MG 24 hr tablet Take 1 tablet (30 mg total) by mouth daily. 90 tablet 4   olmesartan (BENICAR) 20 MG tablet Take 1 tablet (20 mg total) by mouth daily. 30 tablet 3   promethazine-dextromethorphan (PROMETHAZINE-DM) 6.25-15 MG/5ML syrup Take 5 mLs by mouth 4 (four) times daily as needed for cough. 118 mL 0   No facility-administered medications prior to visit.    Allergies  Allergen Reactions   Amoxicillin Itching   Benadryl [Diphenhydramine] Swelling    ROS Review of Systems    Objective:    Physical Exam  BP 108/68   Pulse (!) 103   Ht 5\' 6"  (1.676 m)   Wt 205 lb (93 kg)   SpO2 97%   BMI 33.09 kg/m  Wt Readings from Last 3 Encounters:  02/02/23 205 lb (93 kg)  10/10/22 214 lb (97.1 kg)  08/01/22 210 lb 9.6 oz (95.5 kg)    No results found for: "TSH" Lab Results  Component Value Date   WBC 15.4 (H) 05/11/2022   HGB 11.7 (L) 05/11/2022   HCT 35.5 (L) 05/11/2022   MCV 93.2 05/11/2022   PLT 307 05/11/2022   Lab Results  Component Value Date   NA 139 08/01/2022   K 3.7 08/01/2022   CO2 23 08/01/2022   GLUCOSE 81 08/01/2022   BUN 12 08/01/2022   CREATININE 0.82 08/01/2022   BILITOT 0.4 05/11/2022   ALKPHOS 139 (H) 05/11/2022   AST 21 05/11/2022   ALT 13 05/11/2022   PROT 6.0 (L) 05/11/2022   ALBUMIN 3.3 (L) 05/11/2022   CALCIUM 9.5 08/01/2022   ANIONGAP 11 05/11/2022   EGFR 96 08/01/2022   Lab Results  Component Value Date   CHOL 153 08/01/2022   Lab Results  Component Value Date   HDL 36 (L) 08/01/2022   Lab Results  Component Value Date   LDLCALC 91 08/01/2022   Lab Results  Component Value Date   TRIG 146 08/01/2022   Lab Results  Component Value Date   CHOLHDL 4.3 08/01/2022   Lab Results  Component Value Date   HGBA1C  5.0 11/29/2021      Assessment & Plan:   Problem List Items Addressed This Visit       Cardiovascular and Mediastinum   Essential (primary) hypertension - Primary    BP Readings from  Last 1 Encounters:  02/02/23 108/68   Well-controlled with Nifedipine and Olmesartan alternatively Continue only Olmesartan, and DC Nifedipine Counseled for compliance with the medications Advised DASH diet and moderate exercise/walking, at least 150 mins/week Advised to check BP at home and bring the log in the next visit      Relevant Medications   olmesartan (BENICAR) 20 MG tablet     Respiratory   Allergic rhinitis    Takes Claritin as needed Has Flonase for allergies        Other   Obesity (BMI 30.0-34.9)    BMI Readings from Last 3 Encounters:  02/02/23 33.09 kg/m  10/10/22 34.54 kg/m  08/01/22 33.99 kg/m   Diet modification and moderate exercise advised      Other Visit Diagnoses     Encounter for immunization       Relevant Orders   Flu vaccine trivalent PF, 6mos and older(Flulaval,Afluria,Fluarix,Fluzone) (Completed)       Meds ordered this encounter  Medications   olmesartan (BENICAR) 20 MG tablet    Sig: Take 1 tablet (20 mg total) by mouth daily.    Dispense:  30 tablet    Refill:  5    PLEASE DISCONTINUE NIFEDIPINE.    Follow-up: Return in about 6 months (around 08/02/2023) for Annual physical.    Anabel Halon, MD

## 2023-02-02 NOTE — Assessment & Plan Note (Addendum)
BP Readings from Last 1 Encounters:  02/02/23 108/68   Well-controlled with Nifedipine and Olmesartan alternatively Continue only Olmesartan, and DC Nifedipine Counseled for compliance with the medications Advised DASH diet and moderate exercise/walking, at least 150 mins/week Advised to check BP at home and bring the log in the next visit

## 2023-02-02 NOTE — Patient Instructions (Signed)
Please continue to take Olmesartan as prescribed. Stop taking Nifedipine.  Please continue to follow low salt diet and perform moderate exercise/walking at least 150 mins/week.

## 2023-02-02 NOTE — Assessment & Plan Note (Signed)
Takes Claritin as needed Has Flonase for allergies

## 2023-02-14 ENCOUNTER — Telehealth: Payer: Self-pay

## 2023-02-14 NOTE — Telephone Encounter (Signed)
Medicaid Managed Care   Unsuccessful Outreach Note  02/14/2023 Name: ANNALEIA LEVENGOOD MRN: [id number] DOB: [birth year]  Referred by: Anabel Halon, MD Reason for referral : No chief complaint on file.   An unsuccessful telephone outreach was attempted today. The patient was referred to the case management team for assistance with care management and care coordination.   Follow Up Plan: If patient returns call to provider office, please advise to call Embedded Care Management Care Guide Nicholes Rough* at [phone]*  Nicholes Rough, CMA Care Guide [employer]

## 2023-08-02 ENCOUNTER — Ambulatory Visit (INDEPENDENT_AMBULATORY_CARE_PROVIDER_SITE_OTHER): Payer: Medicaid Other | Admitting: Internal Medicine

## 2023-08-02 ENCOUNTER — Encounter: Payer: Self-pay | Admitting: Internal Medicine

## 2023-08-02 VITALS — BP 138/80 | HR 101 | Ht 66.0 in | Wt 206.4 lb

## 2023-08-02 DIAGNOSIS — Z0001 Encounter for general adult medical examination with abnormal findings: Secondary | ICD-10-CM

## 2023-08-02 DIAGNOSIS — E559 Vitamin D deficiency, unspecified: Secondary | ICD-10-CM | POA: Diagnosis not present

## 2023-08-02 DIAGNOSIS — L659 Nonscarring hair loss, unspecified: Secondary | ICD-10-CM

## 2023-08-02 DIAGNOSIS — E782 Mixed hyperlipidemia: Secondary | ICD-10-CM | POA: Diagnosis not present

## 2023-08-02 DIAGNOSIS — B001 Herpesviral vesicular dermatitis: Secondary | ICD-10-CM | POA: Diagnosis not present

## 2023-08-02 DIAGNOSIS — I10 Essential (primary) hypertension: Secondary | ICD-10-CM

## 2023-08-02 DIAGNOSIS — R739 Hyperglycemia, unspecified: Secondary | ICD-10-CM

## 2023-08-02 MED ORDER — VALACYCLOVIR HCL 1 G PO TABS: 1000.0000 mg | ORAL_TABLET | Freq: Every day | ORAL | 2 refills | Status: AC

## 2023-08-02 MED ORDER — LOSARTAN POTASSIUM 50 MG PO TABS: 50.0000 mg | ORAL_TABLET | Freq: Every day | ORAL | 1 refills | Status: DC

## 2023-08-02 MED ORDER — ACYCLOVIR 5 % EX OINT: 1.0000 | TOPICAL_OINTMENT | CUTANEOUS | 1 refills | Status: AC

## 2023-08-02 NOTE — Assessment & Plan Note (Signed)
 Unclear etiology Although olmesartan has rare association with hair loss, switched to losartan -if persistent, can switch to Spironolactone Continue multivitamin supplement Check CBC, CMP and TSH

## 2023-08-02 NOTE — Progress Notes (Signed)
 Established Patient Office Visit  Subjective:  Patient ID: Laura Andersen, female    DOB: Jan 27, 1991  Age: 33 y.o. MRN: [id number]  CC:  Chief Complaint  Patient presents with   Annual Exam    Cpe , would like to discuss bp medication, thinks its causing hair to fall out.     HPI Laura Andersen is a 33 y.o. female with past medical history of HTN who presents for annual physical.  HTN: Her BP is well controlled today.  Of note, she is taking olmesartan on some days only as she was having hair fall with it.  She denies any headache, dizziness, chest pain, dyspnea or palpitations currently.  She has tried Claritin for allergies.  She had COVID infection in 07/24 and had Paxlovid.  She currently denies any cough, dyspnea or wheezing.  Hair fall: She tried to reduce frequency of olmesartan to every other day, which improved her hair fall.  She has also tried taking hair, skin and nail multivitamin.  She denies any nail or skin abnormality currently.  Denies recent change in appetite, tremors or palpitations.  Recurrent cold sores: She has taken valacyclovir for flareups of cold sore.  She asks about topical agent for cold sore.  Past Medical History:  Diagnosis Date   Furuncle of groin 02/06/2019   Herpes    Hypertension    Renal disorder    kidney stones    Past Surgical History:  Procedure Laterality Date   COSMETIC SURGERY N/A    [employer] 04/07/2020   DILATION AND CURETTAGE OF UTERUS      Family History  Problem Relation Age of Onset   Cancer Maternal Grandmother    Asthma Maternal Grandfather    Hypertension Father    Stroke Father    Diabetes Father    Heart attack Father    Hypertension Mother    Asthma Son     Social History   Socioeconomic History   Marital status: Single    Spouse name: Not on file   Number of children: Not on file   Years of education: Not on file   Highest education level: Not on file  Occupational History   Not on file   Tobacco Use   Smoking status: Former    Current packs/day: 0.00    Types: Cigarettes    Quit date: 02/10/2020    Years since quitting: 3.4   Smokeless tobacco: Never  Vaping Use   Vaping status: Never Used  Substance and Sexual Activity   Alcohol use: Yes    Comment: occ   Drug use: No   Sexual activity: Yes    Birth control/protection: I.U.D.  Other Topics Concern   Not on file  Social History Narrative   Not on file   Social Drivers of Health   Financial Resource Strain: Low Risk  (02/23/2022)   Overall Financial Resource Strain (CARDIA)    Difficulty of Paying Living Expenses: Not very hard  Food Insecurity: No Food Insecurity (05/10/2022)   Hunger Vital Sign    Worried About Running Out of Food in the Last Year: Never true    Ran Out of Food in the Last Year: Never true  Transportation Needs: No Transportation Needs (05/10/2022)   PRAPARE - Administrator, Civil Service (Medical): No    Lack of Transportation (Non-Medical): No  Physical Activity: Inactive (02/23/2022)   Exercise Vital Sign    Days of Exercise per Week: 0 days  Minutes of Exercise per Session: 0 min  Stress: No Stress Concern Present (02/23/2022)   [employer] of Occupational Health - Occupational Stress Questionnaire    Feeling of Stress : Not at all  Social Connections: Moderately Isolated (02/23/2022)   Social Connection and Isolation Panel [NHANES]    Frequency of Communication with Friends and Family: More than three times a week    Frequency of Social Gatherings with Friends and Family: Once a week    Attends Religious Services: 1 to 4 times per year    Active Member of [employer] or Organizations: No    Attends Banker Meetings: Never    Marital Status: Never married  Intimate Partner Violence: Not At Risk (05/10/2022)   Humiliation, Afraid, Rape, and Kick questionnaire    Fear of Current or Ex-Partner: No    Emotionally Abused: No    Physically Abused: No     Sexually Abused: No    Outpatient Medications Prior to Visit  Medication Sig Dispense Refill   fluticasone (FLONASE) 50 MCG/ACT nasal spray Place 2 sprays into both nostrils daily. 16 g 0   levonorgestrel (MIRENA) 20 MCG/DAY IUD 1 each by Intrauterine route once.     loratadine (CLARITIN) 10 MG tablet Take 1 tablet (10 mg total) by mouth daily as needed for allergies or itching. 30 tablet 5   Blood Pressure Monitor MISC For regular home bp monitoring during pregnancy 1 each 0   olmesartan (BENICAR) 20 MG tablet Take 1 tablet (20 mg total) by mouth daily. (Patient taking differently: Take 20 mg by mouth daily. Takes it some days) 30 tablet 5   valACYclovir (VALTREX) 1000 MG tablet Take 1 tablet (1,000 mg total) by mouth daily. 30 tablet 6   No facility-administered medications prior to visit.    Allergies  Allergen Reactions   Amoxicillin Itching   Benadryl [Diphenhydramine] Swelling    ROS Review of Systems  Constitutional:  Negative for chills and fever.  HENT:  Negative for congestion, sinus pressure, sinus pain and sore throat.   Eyes:  Negative for pain and discharge.  Respiratory:  Negative for cough and shortness of breath.   Cardiovascular:  Negative for chest pain and palpitations.  Gastrointestinal:  Negative for abdominal pain, constipation, diarrhea, nausea and vomiting.  Endocrine: Negative for polydipsia and polyuria.  Genitourinary:  Negative for dysuria and hematuria.  Musculoskeletal:  Negative for neck pain and neck stiffness.  Skin:  Negative for rash.       Perioral cold sores  Neurological:  Negative for dizziness and weakness.  Psychiatric/Behavioral:  Negative for agitation and behavioral problems.       Objective:    Physical Exam Vitals reviewed.  Constitutional:      General: She is not in acute distress.    Appearance: She is obese. She is not diaphoretic.  HENT:     Head: Normocephalic and atraumatic.     Nose: Nose normal. No congestion.      Mouth/Throat:     Mouth: Mucous membranes are moist.     Pharynx: No posterior oropharyngeal erythema.  Eyes:     General: No scleral icterus.    Extraocular Movements: Extraocular movements intact.  Cardiovascular:     Rate and Rhythm: Normal rate and regular rhythm.     Heart sounds: Normal heart sounds. No murmur heard. Pulmonary:     Breath sounds: Normal breath sounds. No wheezing or rales.  Abdominal:     Palpations: Abdomen is soft.  Tenderness: There is no abdominal tenderness.  Musculoskeletal:     Cervical back: Neck supple. No tenderness.     Right lower leg: No edema.     Left lower leg: No edema.  Skin:    General: Skin is warm.     Findings: No rash.     Comments: Vesicular rash around angle of lip  Neurological:     General: No focal deficit present.     Mental Status: She is alert and oriented to person, place, and time.     Cranial Nerves: No cranial nerve deficit.     Sensory: No sensory deficit.     Motor: No weakness.  Psychiatric:        Mood and Affect: Mood normal.        Behavior: Behavior normal.     BP 138/80 (BP Location: Left Arm)   Pulse (!) 101   Ht 5\' 6"  (1.676 m)   Wt 206 lb 6.4 oz (93.6 kg)   SpO2 96%   BMI 33.31 kg/m  Wt Readings from Last 3 Encounters:  08/02/23 206 lb 6.4 oz (93.6 kg)  02/02/23 205 lb (93 kg)  10/10/22 214 lb (97.1 kg)    No results found for: "TSH" Lab Results  Component Value Date   WBC 15.4 (H) 05/11/2022   HGB 11.7 (L) 05/11/2022   HCT 35.5 (L) 05/11/2022   MCV 93.2 05/11/2022   PLT 307 05/11/2022   Lab Results  Component Value Date   NA 139 08/01/2022   K 3.7 08/01/2022   CO2 23 08/01/2022   GLUCOSE 81 08/01/2022   BUN 12 08/01/2022   CREATININE 0.82 08/01/2022   BILITOT 0.4 05/11/2022   ALKPHOS 139 (H) 05/11/2022   AST 21 05/11/2022   ALT 13 05/11/2022   PROT 6.0 (L) 05/11/2022   ALBUMIN 3.3 (L) 05/11/2022   CALCIUM 9.5 08/01/2022   ANIONGAP 11 05/11/2022   EGFR 96 08/01/2022   Lab  Results  Component Value Date   CHOL 153 08/01/2022   Lab Results  Component Value Date   HDL 36 (L) 08/01/2022   Lab Results  Component Value Date   LDLCALC 91 08/01/2022   Lab Results  Component Value Date   TRIG 146 08/01/2022   Lab Results  Component Value Date   CHOLHDL 4.3 08/01/2022   Lab Results  Component Value Date   HGBA1C 5.0 11/29/2021      Assessment & Plan:   Problem List Items Addressed This Visit       Cardiovascular and Mediastinum   Essential (primary) hypertension   BP Readings from Last 1 Encounters:  08/02/23 138/80   Well-controlled with Olmesartan 20 mg QD today She reports hair fall due to it, unclear association - would change to Losartan 50 mg QD If persistent hair fall, can switch to spironolactone instead Counseled for compliance with the medications Advised DASH diet and moderate exercise/walking, at least 150 mins/week Advised to check BP at home and bring the log in the next visit      Relevant Medications   losartan (COZAAR) 50 MG tablet   Other Relevant Orders   CBC with Differential/Platelet   CMP14+EGFR   TSH     Digestive   Recurrent cold sores   Refilled oral valacyclovir for acute flareups Can try acyclovir ointment as initial treatment      Relevant Medications   valACYclovir (VALTREX) 1000 MG tablet   acyclovir ointment (ZOVIRAX) 5 %  Other   Encounter for general adult medical examination with abnormal findings - Primary   Physical exam as documented. Counseling done  re healthy lifestyle involving commitment to 150 minutes exercise per week, heart healthy diet, and attaining healthy weight.The importance of adequate sleep also discussed. Changes in health habits are decided on by the patient with goals and time frames  set for achieving them. Immunization and cancer screening needs are specifically addressed at this visit.      Hair loss   Unclear etiology Although olmesartan has rare association with  hair loss, switched to losartan -if persistent, can switch to Spironolactone Continue multivitamin supplement Check CBC, CMP and TSH      Other Visit Diagnoses       Mixed hyperlipidemia       Relevant Medications   losartan (COZAAR) 50 MG tablet   Other Relevant Orders   Lipid Profile     Vitamin D deficiency       Relevant Orders   Vitamin D (25 hydroxy)     Hyperglycemia       Relevant Orders   Hemoglobin A1c       Meds ordered this encounter  Medications   losartan (COZAAR) 50 MG tablet    Sig: Take 1 tablet (50 mg total) by mouth daily.    Dispense:  90 tablet    Refill:  1    PLEASE DISCONTINUE OLMESARTAN.   valACYclovir (VALTREX) 1000 MG tablet    Sig: Take 1 tablet (1,000 mg total) by mouth daily.    Dispense:  30 tablet    Refill:  2   acyclovir ointment (ZOVIRAX) 5 %    Sig: Apply 1 Application topically every 3 (three) hours.    Dispense:  30 g    Refill:  1    Follow-up: Return in about 6 months (around 02/02/2024) for HTN.    Anabel Halon, MD

## 2023-08-02 NOTE — Assessment & Plan Note (Signed)
 Refilled oral valacyclovir for acute flareups Can try acyclovir ointment as initial treatment

## 2023-08-02 NOTE — Patient Instructions (Addendum)
 Please start taking Losartan 50 mg once daily instead of Olmesartan.  Please apply Acyclovir ointment as needed for cold sore. Can take Valacyclovir 1000 mg twice daily for 7 days for acute flare up or persistent cold sore.  Please continue to take medications as prescribed.  Please continue to follow DASH diet and perform moderate exercise/walking at least 150 mins/week.

## 2023-08-02 NOTE — Assessment & Plan Note (Signed)
 BP Readings from Last 1 Encounters:  08/02/23 138/80   Well-controlled with Olmesartan 20 mg QD today She reports hair fall due to it, unclear association - would change to Losartan 50 mg QD If persistent hair fall, can switch to spironolactone instead Counseled for compliance with the medications Advised DASH diet and moderate exercise/walking, at least 150 mins/week Advised to check BP at home and bring the log in the next visit

## 2023-08-02 NOTE — Assessment & Plan Note (Signed)

## 2023-08-03 ENCOUNTER — Encounter: Payer: Self-pay | Admitting: Internal Medicine

## 2023-08-03 LAB — CBC WITH DIFFERENTIAL/PLATELET
Basophils Absolute: 0.1 10*3/uL (ref 0.0–0.2)
Basos: 1 %
EOS (ABSOLUTE): 0.1 10*3/uL (ref 0.0–0.4)
Eos: 1 %
Hematocrit: 38.3 % (ref 34.0–46.6)
Hemoglobin: 12.8 g/dL (ref 11.1–15.9)
Immature Grans (Abs): 0 10*3/uL (ref 0.0–0.1)
Immature Granulocytes: 0 %
Lymphocytes Absolute: 2.7 10*3/uL (ref 0.7–3.1)
Lymphs: 31 %
MCH: 30.2 pg (ref 26.6–33.0)
MCHC: 33.4 g/dL (ref 31.5–35.7)
MCV: 90 fL (ref 79–97)
Monocytes Absolute: 0.6 10*3/uL (ref 0.1–0.9)
Monocytes: 7 %
Neutrophils Absolute: 5.3 10*3/uL (ref 1.4–7.0)
Neutrophils: 60 %
Platelets: 446 10*3/uL (ref 150–450)
RBC: 4.24 x10E6/uL (ref 3.77–5.28)
RDW: 11.8 % (ref 11.7–15.4)
WBC: 8.8 10*3/uL (ref 3.4–10.8)

## 2023-08-03 LAB — CMP14+EGFR
ALT: 12 IU/L (ref 0–32)
AST: 15 IU/L (ref 0–40)
Albumin: 4.5 g/dL (ref 3.9–4.9)
Alkaline Phosphatase: 108 IU/L (ref 44–121)
BUN/Creatinine Ratio: 14 (ref 9–23)
BUN: 9 mg/dL (ref 6–20)
Bilirubin Total: 0.3 mg/dL (ref 0.0–1.2)
CO2: 19 mmol/L — ABNORMAL LOW (ref 20–29)
Calcium: 9.1 mg/dL (ref 8.7–10.2)
Chloride: 105 mmol/L (ref 96–106)
Creatinine, Ser: 0.66 mg/dL (ref 0.57–1.00)
Globulin, Total: 2.4 g/dL (ref 1.5–4.5)
Glucose: 81 mg/dL (ref 70–99)
Potassium: 4.4 mmol/L (ref 3.5–5.2)
Sodium: 139 mmol/L (ref 134–144)
Total Protein: 6.9 g/dL (ref 6.0–8.5)
eGFR: 117 mL/min/{1.73_m2} (ref >59)

## 2023-08-03 LAB — LIPID PANEL
Chol/HDL Ratio: 5.3 ratio — ABNORMAL HIGH (ref 0.0–4.4)
Cholesterol, Total: 163 mg/dL (ref 100–199)
HDL: 31 mg/dL — ABNORMAL LOW (ref >39)
LDL Chol Calc (NIH): 111 mg/dL — ABNORMAL HIGH (ref 0–99)
Triglycerides: 115 mg/dL (ref 0–149)
VLDL Cholesterol Cal: 21 mg/dL (ref 5–40)

## 2023-08-03 LAB — HEMOGLOBIN A1C
Est. average glucose Bld gHb Est-mCnc: 103 mg/dL
Hgb A1c MFr Bld: 5.2 % (ref 4.8–5.6)

## 2023-08-03 LAB — VITAMIN D 25 HYDROXY (VIT D DEFICIENCY, FRACTURES): Vit D, 25-Hydroxy: 8 ng/mL — ABNORMAL LOW (ref 30.0–100.0)

## 2023-08-03 LAB — TSH: TSH: 1.44 u[IU]/mL (ref 0.450–4.500)

## 2023-08-03 MED ORDER — VITAMIN D (ERGOCALCIFEROL) 1.25 MG (50000 UNIT) PO CAPS: 50000.0000 [IU] | ORAL_CAPSULE | ORAL | 1 refills | Status: DC

## 2023-08-03 NOTE — Addendum Note (Signed)
 Addended byTrena Platt on: 08/03/2023 07:56 AM   Modules accepted: Orders

## 2023-08-08 ENCOUNTER — Encounter: Payer: Self-pay | Admitting: Internal Medicine

## 2023-08-08 ENCOUNTER — Other Ambulatory Visit: Payer: Self-pay | Admitting: Internal Medicine

## 2023-08-25 ENCOUNTER — Encounter: Payer: Self-pay | Admitting: Internal Medicine

## 2023-10-18 ENCOUNTER — Other Ambulatory Visit (HOSPITAL_COMMUNITY)
Admission: RE | Admit: 2023-10-18 | Discharge: 2023-10-18 | Disposition: A | Discharge status: Home / Self Care | Source: Ambulatory Visit | Attending: Obstetrics & Gynecology | Admitting: Obstetrics & Gynecology

## 2023-10-18 ENCOUNTER — Ambulatory Visit (INDEPENDENT_AMBULATORY_CARE_PROVIDER_SITE_OTHER)

## 2023-10-18 DIAGNOSIS — N898 Other specified noninflammatory disorders of vagina: Secondary | ICD-10-CM | POA: Insufficient documentation

## 2023-10-18 NOTE — Progress Notes (Signed)
   NURSE VISIT- VAGINITIS/STD  SUBJECTIVE:  Laura Andersen is a 33 y.o. [id number] GYN patientfemale here for a vaginal swab for vaginitis screening, STD screen.  She reports the following symptoms: discharge described as white and odor for 1-2 days. Denies abnormal vaginal bleeding, significant pelvic pain, fever, or UTI symptoms. States she had sex with a new partner and "the condom broke and I want to be careful"  OBJECTIVE:  There were no vitals taken for this visit.  Appears well, in no apparent distress  ASSESSMENT: Vaginal swab for vaginitis screening  PLAN: Self-collected vaginal probe for Gonorrhea, Chlamydia, Trichomonas, Bacterial Vaginosis, Yeast sent to lab Treatment: to be determined once results are received Follow-up as needed if symptoms persist/worsen, or new symptoms develop  Alyssa Jumper  10/18/2023 3:28 PM

## 2023-10-20 LAB — CERVICOVAGINAL ANCILLARY ONLY
Bacterial Vaginitis (gardnerella): NEGATIVE
Candida Glabrata: NEGATIVE
Candida Vaginitis: POSITIVE — AB
Chlamydia: NEGATIVE
Comment: NEGATIVE
Comment: NEGATIVE
Comment: NEGATIVE
Comment: NEGATIVE
Comment: NEGATIVE
Comment: NORMAL
Neisseria Gonorrhea: NEGATIVE
Trichomonas: NEGATIVE

## 2023-10-23 ENCOUNTER — Ambulatory Visit: Payer: Self-pay | Admitting: Adult Health

## 2023-10-23 MED ORDER — FLUCONAZOLE 150 MG PO TABS
ORAL_TABLET | ORAL | 1 refills | Status: AC
Start: 2023-10-23 — End: ?

## 2024-02-02 ENCOUNTER — Ambulatory Visit: Admitting: Internal Medicine

## 2024-03-06 ENCOUNTER — Encounter: Payer: Self-pay | Admitting: Internal Medicine

## 2024-03-06 ENCOUNTER — Ambulatory Visit: Admitting: Internal Medicine

## 2024-03-06 VITALS — BP 140/88 | HR 90 | Ht 66.0 in | Wt 204.0 lb

## 2024-03-06 DIAGNOSIS — R739 Hyperglycemia, unspecified: Secondary | ICD-10-CM | POA: Diagnosis not present

## 2024-03-06 DIAGNOSIS — J302 Other seasonal allergic rhinitis: Secondary | ICD-10-CM | POA: Diagnosis not present

## 2024-03-06 DIAGNOSIS — E559 Vitamin D deficiency, unspecified: Secondary | ICD-10-CM | POA: Diagnosis not present

## 2024-03-06 DIAGNOSIS — E66811 Obesity, class 1: Secondary | ICD-10-CM

## 2024-03-06 DIAGNOSIS — B001 Herpesviral vesicular dermatitis: Secondary | ICD-10-CM

## 2024-03-06 DIAGNOSIS — Z23 Encounter for immunization: Secondary | ICD-10-CM

## 2024-03-06 DIAGNOSIS — E782 Mixed hyperlipidemia: Secondary | ICD-10-CM | POA: Diagnosis not present

## 2024-03-06 DIAGNOSIS — I10 Essential (primary) hypertension: Secondary | ICD-10-CM

## 2024-03-06 MED ORDER — VITAMIN D (ERGOCALCIFEROL) 1.25 MG (50000 UNIT) PO CAPS
50000.0000 [IU] | ORAL_CAPSULE | ORAL | 1 refills | Status: AC
Start: 2024-03-06 — End: ?

## 2024-03-06 MED ORDER — AMLODIPINE BESYLATE 5 MG PO TABS: 5.0000 mg | ORAL_TABLET | Freq: Every day | ORAL | 1 refills | Status: AC

## 2024-03-06 NOTE — Assessment & Plan Note (Signed)
 BMI Readings from Last 3 Encounters:  03/06/24 32.93 kg/m  08/02/23 33.31 kg/m  02/02/23 33.09 kg/m   DASH diet and moderate exercise advised

## 2024-03-06 NOTE — Assessment & Plan Note (Signed)
Takes Claritin as needed Has Flonase for allergies

## 2024-03-06 NOTE — Assessment & Plan Note (Signed)
Lipid profile reviewed Advised to follow DASH diet 

## 2024-03-06 NOTE — Patient Instructions (Addendum)
 Please start taking Amlodipine  5 mg once daily.  Please stop taking Losartan .  Please continue to take other medications as prescribed.  Please continue to follow DASH diet and perform moderate exercise/walking at least 150 mins/week.  Please get fasting blood tests done before the next visit.

## 2024-03-06 NOTE — Assessment & Plan Note (Signed)
 Refilled oral valacyclovir for acute flareups Can try acyclovir ointment as initial treatment

## 2024-03-06 NOTE — Progress Notes (Signed)
 Established Patient Office Visit  Subjective:  Patient ID: Laura Andersen, female    DOB: 1991/04/24  Age: 33 y.o. MRN: [id number]  CC:  Chief Complaint  Patient presents with   Hypertension    Six month follow up     HPI Laura Andersen is a 33 y.o. female with past medical history of HTN who presents for f/u of her chronic medical conditions.  HTN: Her BP is elevated today.  Of note, she is taking losartan  on some days only when she feels her BP is elevated.  She denies any headache, dizziness, chest pain, dyspnea or palpitations currently.  She has tried Claritin  for allergies. She currently denies any cough, dyspnea or wheezing.  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  Past Medical History:  Diagnosis Date   Furuncle of groin 02/06/2019   Herpes    Hypertension    Renal disorder    kidney stones    Past Surgical History:  Procedure Laterality Date   COSMETIC SURGERY N/A    [employer] 04/07/2020   DILATION AND CURETTAGE OF UTERUS      Family History  Problem Relation Age of Onset   Cancer Maternal Grandmother    Asthma Maternal Grandfather    Hypertension Father    Stroke Father    Diabetes Father    Heart attack Father    Hypertension Mother    Asthma Son     Social History   Socioeconomic History   Marital status: Single    Spouse name: Not on file   Number of children: Not on file   Years of education: Not on file   Highest education level: Associate degree: occupational, Scientist, product/process development, or vocational program  Occupational History   Not on file  Tobacco Use   Smoking status: Former    Current packs/day: 0.00    Types: Cigarettes    Quit date: 02/10/2020    Years since quitting: 4.0   Smokeless tobacco: Never  Vaping Use   Vaping status: Never Used  Substance and Sexual Activity   Alcohol use: Yes    Comment: occ   Drug use: No   Sexual activity: Yes    Birth control/protection: I.U.D.  Other Topics  Concern   Not on file  Social History Narrative   Not on file   Social Drivers of Health   Financial Resource Strain: Low Risk  (01/29/2024)   Overall Financial Resource Strain (CARDIA)    Difficulty of Paying Living Expenses: Not very hard  Food Insecurity: No Food Insecurity (01/29/2024)   Hunger Vital Sign    Worried About Running Out of Food in the Last Year: Never true    Ran Out of Food in the Last Year: Never true  Transportation Needs: No Transportation Needs (01/29/2024)   PRAPARE - Administrator, Civil Service (Medical): No    Lack of Transportation (Non-Medical): No  Physical Activity: Inactive (01/29/2024)   Exercise Vital Sign    Days of Exercise per Week: 0 days    Minutes of Exercise per Session: Not on file  Stress: No Stress Concern Present (01/29/2024)   [employer] of Occupational Health - Occupational Stress Questionnaire    Feeling of Stress: Not at all  Social Connections: Moderately Isolated (01/29/2024)   Social Connection and Isolation Panel    Frequency of Communication with Friends and Family: More than three times a week    Frequency of Social Gatherings with Friends  and Family: Twice a week    Attends Religious Services: 1 to 4 times per year    Active Member of Clubs or Organizations: No    Attends Engineer, structural: Not on file    Marital Status: Never married  Intimate Partner Violence: Not At Risk (05/10/2022)   Humiliation, Afraid, Rape, and Kick questionnaire    Fear of Current or Ex-Partner: No    Emotionally Abused: No    Physically Abused: No    Sexually Abused: No    Outpatient Medications Prior to Visit  Medication Sig Dispense Refill   acyclovir  ointment (ZOVIRAX ) 5 % Apply 1 Application topically every 3 (three) hours. 30 g 1   fluconazole  (DIFLUCAN ) 150 MG tablet Take 1 now and 1 in 3 days 2 tablet 1   fluticasone  (FLONASE ) 50 MCG/ACT nasal spray Place 2 sprays into both nostrils daily. 16 g 0   levonorgestrel   (MIRENA ) 20 MCG/DAY IUD 1 each by Intrauterine route once.     loratadine  (CLARITIN ) 10 MG tablet Take 1 tablet (10 mg total) by mouth daily as needed for allergies or itching. 30 tablet 5   valACYclovir  (VALTREX ) 1000 MG tablet Take 1 tablet (1,000 mg total) by mouth daily. 30 tablet 2   losartan  (COZAAR ) 50 MG tablet Take 1 tablet (50 mg total) by mouth daily. 90 tablet 1   Vitamin D , Ergocalciferol , (DRISDOL ) 1.25 MG (50000 UNIT) CAPS capsule Take 1 capsule (50,000 Units total) by mouth every 7 (seven) days. 12 capsule 1   No facility-administered medications prior to visit.    Allergies  Allergen Reactions   Amoxicillin Itching   Benadryl  [Diphenhydramine ] Swelling    ROS Review of Systems  Constitutional:  Negative for chills and fever.  HENT:  Negative for congestion, sinus pressure, sinus pain and sore throat.   Eyes:  Negative for pain and discharge.  Respiratory:  Negative for cough and shortness of breath.   Cardiovascular:  Negative for chest pain and palpitations.  Gastrointestinal:  Negative for abdominal pain, diarrhea, nausea and vomiting.  Endocrine: Negative for polydipsia and polyuria.  Genitourinary:  Negative for dysuria and hematuria.  Musculoskeletal:  Negative for neck pain and neck stiffness.  Skin:  Negative for rash.       Perioral cold sores  Neurological:  Negative for dizziness and weakness.  Psychiatric/Behavioral:  Negative for agitation and behavioral problems.       Objective:    Physical Exam Vitals reviewed.  Constitutional:      General: She is not in acute distress.    Appearance: She is obese. She is not diaphoretic.  HENT:     Head: Normocephalic and atraumatic.     Nose: Nose normal. No congestion.     Mouth/Throat:     Mouth: Mucous membranes are moist.     Pharynx: No posterior oropharyngeal erythema.  Eyes:     General: No scleral icterus.    Extraocular Movements: Extraocular movements intact.  Cardiovascular:     Rate and  Rhythm: Normal rate and regular rhythm.     Heart sounds: Normal heart sounds. No murmur heard. Pulmonary:     Breath sounds: Normal breath sounds. No wheezing or rales.  Musculoskeletal:     Cervical back: Neck supple. No tenderness.     Right lower leg: No edema.     Left lower leg: No edema.  Skin:    General: Skin is warm.     Findings: No rash.  Neurological:  General: No focal deficit present.     Mental Status: She is alert and oriented to person, place, and time.     Sensory: No sensory deficit.     Motor: No weakness.  Psychiatric:        Mood and Affect: Mood normal.        Behavior: Behavior normal.     BP (!) 140/88 (BP Location: Left Arm)   Pulse 90   Ht 5' 6 (1.676 m)   Wt 204 lb (92.5 kg)   SpO2 98%   BMI 32.93 kg/m  Wt Readings from Last 3 Encounters:  03/06/24 204 lb (92.5 kg)  08/02/23 206 lb 6.4 oz (93.6 kg)  02/02/23 205 lb (93 kg)    Lab Results  Component Value Date   TSH 1.440 08/02/2023   Lab Results  Component Value Date   WBC 8.8 08/02/2023   HGB 12.8 08/02/2023   HCT 38.3 08/02/2023   MCV 90 08/02/2023   PLT 446 08/02/2023   Lab Results  Component Value Date   NA 139 08/02/2023   K 4.4 08/02/2023   CO2 19 (L) 08/02/2023   GLUCOSE 81 08/02/2023   BUN 9 08/02/2023   CREATININE 0.66 08/02/2023   BILITOT 0.3 08/02/2023   ALKPHOS 108 08/02/2023   AST 15 08/02/2023   ALT 12 08/02/2023   PROT 6.9 08/02/2023   ALBUMIN 4.5 08/02/2023   CALCIUM 9.1 08/02/2023   ANIONGAP 11 05/11/2022   EGFR 117 08/02/2023   Lab Results  Component Value Date   CHOL 163 08/02/2023   Lab Results  Component Value Date   HDL 31 (L) 08/02/2023   Lab Results  Component Value Date   LDLCALC 111 (H) 08/02/2023   Lab Results  Component Value Date   TRIG 115 08/02/2023   Lab Results  Component Value Date   CHOLHDL 5.3 (H) 08/02/2023   Lab Results  Component Value Date   HGBA1C 5.2 08/02/2023      Assessment & Plan:   Problem List  Items Addressed This Visit       Cardiovascular and Mediastinum   Essential (primary) hypertension - Primary   BP Readings from Last 1 Encounters:  03/06/24 (!) 140/88   Uncontrolled due to noncompliance Had hair fall with olmesartan , had switched to losartan , but she is taking it as needed Switched to amlodipine  5 mg QD Advised to contact with BP readings after 2 weeks Counseled for compliance with the medications Advised DASH diet and moderate exercise/walking, at least 150 mins/week Advised to check BP at home and bring the log in the next visit      Relevant Medications   amLODipine  (NORVASC ) 5 MG tablet   Other Relevant Orders   TSH   CMP14+EGFR   CBC with Differential/Platelet     Respiratory   Allergic rhinitis   Takes Claritin  as needed Has Flonase  for allergies        Digestive   Recurrent cold sores   Refilled oral valacyclovir  for acute flareups Can try acyclovir  ointment as initial treatment        Other   Obesity (BMI 30.0-34.9)   BMI Readings from Last 3 Encounters:  03/06/24 32.93 kg/m  08/02/23 33.31 kg/m  02/02/23 33.09 kg/m   DASH diet and moderate exercise advised      Vitamin D  deficiency   Last vitamin D  Lab Results  Component Value Date   VD25OH 8.0 (L) 08/02/2023   Vitamin D  50,000 IU once weekly  prescribed      Relevant Medications   Vitamin D , Ergocalciferol , (DRISDOL ) 1.25 MG (50000 UNIT) CAPS capsule   Other Relevant Orders   VITAMIN D  25 Hydroxy (Vit-D Deficiency, Fractures)   Mixed hyperlipidemia   Lipid profile reviewed Advised to follow DASH diet      Relevant Medications   amLODipine  (NORVASC ) 5 MG tablet   Other Relevant Orders   Lipid panel   Other Visit Diagnoses       Hyperglycemia       Relevant Orders   Hemoglobin A1c     Encounter for immunization       Relevant Orders   Flu vaccine trivalent PF, 6mos and older(Flulaval,Afluria,Fluarix,Fluzone) (Completed)       Meds ordered this encounter   Medications   amLODipine  (NORVASC ) 5 MG tablet    Sig: Take 1 tablet (5 mg total) by mouth daily.    Dispense:  90 tablet    Refill:  1   Vitamin D , Ergocalciferol , (DRISDOL ) 1.25 MG (50000 UNIT) CAPS capsule    Sig: Take 1 capsule (50,000 Units total) by mouth every 7 (seven) days.    Dispense:  12 capsule    Refill:  1    Follow-up: Return in about 5 months (around 08/04/2024) for HTN.    Laura MARLA Blanch, MD

## 2024-03-06 NOTE — Assessment & Plan Note (Signed)
 Last vitamin D  Lab Results  Component Value Date   VD25OH 8.0 (L) 08/02/2023   Vitamin D  50,000 IU once weekly prescribed

## 2024-03-06 NOTE — Assessment & Plan Note (Signed)
 BP Readings from Last 1 Encounters:  03/06/24 (!) 140/88   Uncontrolled due to noncompliance Had hair fall with olmesartan , had switched to losartan , but she is taking it as needed Switched to amlodipine  5 mg QD Advised to contact with BP readings after 2 weeks Counseled for compliance with the medications Advised DASH diet and moderate exercise/walking, at least 150 mins/week Advised to check BP at home and bring the log in the next visit

## 2024-06-13 ENCOUNTER — Ambulatory Visit: Admitting: Obstetrics & Gynecology

## 2024-08-07 ENCOUNTER — Ambulatory Visit: Admitting: Internal Medicine
